# Patient Record
Sex: Female | Born: 1960 | Race: White | Hispanic: No | Marital: Single | Smoking: Never smoker
Health system: Southern US, Community
[De-identification: ages and names within clinical notes are randomized; demographics above are authoritative.]

## PROBLEM LIST (undated history)

## (undated) DIAGNOSIS — M19049 Primary osteoarthritis, unspecified hand: Secondary | ICD-10-CM

## (undated) DIAGNOSIS — Z8489 Family history of other specified conditions: Secondary | ICD-10-CM

## (undated) DIAGNOSIS — J309 Allergic rhinitis, unspecified: Secondary | ICD-10-CM

## (undated) DIAGNOSIS — F988 Other specified behavioral and emotional disorders with onset usually occurring in childhood and adolescence: Secondary | ICD-10-CM

## (undated) DIAGNOSIS — Z973 Presence of spectacles and contact lenses: Secondary | ICD-10-CM

## (undated) DIAGNOSIS — J320 Chronic maxillary sinusitis: Secondary | ICD-10-CM

## (undated) DIAGNOSIS — R42 Dizziness and giddiness: Secondary | ICD-10-CM

## (undated) DIAGNOSIS — F4321 Adjustment disorder with depressed mood: Secondary | ICD-10-CM

## (undated) DIAGNOSIS — N921 Excessive and frequent menstruation with irregular cycle: Secondary | ICD-10-CM

## (undated) DIAGNOSIS — I493 Ventricular premature depolarization: Secondary | ICD-10-CM

## (undated) DIAGNOSIS — R011 Cardiac murmur, unspecified: Secondary | ICD-10-CM

## (undated) DIAGNOSIS — Z9889 Other specified postprocedural states: Secondary | ICD-10-CM

## (undated) DIAGNOSIS — R112 Nausea with vomiting, unspecified: Secondary | ICD-10-CM

## (undated) DIAGNOSIS — H8109 Meniere's disease, unspecified ear: Secondary | ICD-10-CM

## (undated) HISTORY — PX: APPENDECTOMY: SHX54

## (undated) HISTORY — DX: Primary osteoarthritis, unspecified hand: M19.049

## (undated) HISTORY — DX: Ventricular premature depolarization: I49.3

## (undated) HISTORY — DX: Dizziness and giddiness: R42

## (undated) HISTORY — DX: Allergic rhinitis, unspecified: J30.9

## (undated) HISTORY — DX: Chronic maxillary sinusitis: J32.0

## (undated) HISTORY — DX: Excessive and frequent menstruation with irregular cycle: N92.1

## (undated) HISTORY — DX: Adjustment disorder with depressed mood: F43.21

---

## 1998-08-30 ENCOUNTER — Other Ambulatory Visit: Admission: RE | Admit: 1998-08-30 | Discharge: 1998-08-30 | Payer: Self-pay | Admitting: *Deleted

## 1999-06-08 ENCOUNTER — Other Ambulatory Visit: Admission: RE | Admit: 1999-06-08 | Discharge: 1999-06-08 | Payer: Self-pay | Admitting: Obstetrics and Gynecology

## 1999-07-11 ENCOUNTER — Other Ambulatory Visit: Admission: RE | Admit: 1999-07-11 | Discharge: 1999-07-11 | Payer: Self-pay | Admitting: Obstetrics and Gynecology

## 1999-07-18 ENCOUNTER — Encounter: Admission: RE | Admit: 1999-07-18 | Discharge: 1999-07-18 | Payer: Self-pay | Admitting: Obstetrics and Gynecology

## 1999-07-18 ENCOUNTER — Encounter: Payer: Self-pay | Admitting: Obstetrics and Gynecology

## 1999-08-15 ENCOUNTER — Other Ambulatory Visit: Admission: RE | Admit: 1999-08-15 | Discharge: 1999-08-15 | Payer: Self-pay | Admitting: Obstetrics and Gynecology

## 1999-11-01 ENCOUNTER — Emergency Department (HOSPITAL_COMMUNITY): Admission: EM | Admit: 1999-11-01 | Discharge: 1999-11-01 | Payer: Self-pay | Admitting: Emergency Medicine

## 2000-12-11 ENCOUNTER — Other Ambulatory Visit: Admission: RE | Admit: 2000-12-11 | Discharge: 2000-12-11 | Payer: Self-pay | Admitting: *Deleted

## 2001-03-24 ENCOUNTER — Encounter: Admission: RE | Admit: 2001-03-24 | Discharge: 2001-03-24 | Payer: Self-pay | Admitting: *Deleted

## 2001-03-24 ENCOUNTER — Encounter: Payer: Self-pay | Admitting: *Deleted

## 2002-01-16 ENCOUNTER — Other Ambulatory Visit: Admission: RE | Admit: 2002-01-16 | Discharge: 2002-01-16 | Payer: Self-pay | Admitting: Obstetrics and Gynecology

## 2002-04-21 ENCOUNTER — Encounter: Payer: Self-pay | Admitting: Cardiovascular Disease

## 2002-04-21 ENCOUNTER — Encounter: Admission: RE | Admit: 2002-04-21 | Discharge: 2002-04-21 | Payer: Self-pay | Admitting: Cardiovascular Disease

## 2003-01-19 ENCOUNTER — Other Ambulatory Visit: Admission: RE | Admit: 2003-01-19 | Discharge: 2003-01-19 | Payer: Self-pay | Admitting: Obstetrics and Gynecology

## 2003-09-25 HISTORY — PX: ENDOMETRIAL ABLATION: SHX621

## 2003-09-25 HISTORY — PX: DILATION AND CURETTAGE OF UTERUS: SHX78

## 2003-10-06 ENCOUNTER — Other Ambulatory Visit: Admission: RE | Admit: 2003-10-06 | Discharge: 2003-10-06 | Payer: Self-pay | Admitting: Family Medicine

## 2003-11-26 ENCOUNTER — Emergency Department (HOSPITAL_COMMUNITY): Admission: EM | Admit: 2003-11-26 | Discharge: 2003-11-26 | Payer: Self-pay | Admitting: Emergency Medicine

## 2004-04-26 ENCOUNTER — Encounter (INDEPENDENT_AMBULATORY_CARE_PROVIDER_SITE_OTHER): Payer: Self-pay | Admitting: Specialist

## 2004-04-26 ENCOUNTER — Ambulatory Visit (HOSPITAL_COMMUNITY): Admission: RE | Admit: 2004-04-26 | Discharge: 2004-04-26 | Payer: Self-pay | Admitting: *Deleted

## 2004-05-01 ENCOUNTER — Encounter: Admission: RE | Admit: 2004-05-01 | Discharge: 2004-05-01 | Payer: Self-pay | Admitting: Family Medicine

## 2004-06-06 ENCOUNTER — Encounter: Admission: RE | Admit: 2004-06-06 | Discharge: 2004-06-06 | Payer: Self-pay | Admitting: *Deleted

## 2005-08-28 ENCOUNTER — Encounter: Admission: RE | Admit: 2005-08-28 | Discharge: 2005-08-28 | Payer: Self-pay | Admitting: *Deleted

## 2006-09-24 HISTORY — PX: INGUINAL HERNIA REPAIR: SUR1180

## 2007-02-19 ENCOUNTER — Ambulatory Visit (HOSPITAL_BASED_OUTPATIENT_CLINIC_OR_DEPARTMENT_OTHER): Admission: RE | Admit: 2007-02-19 | Discharge: 2007-02-19 | Payer: Self-pay | Admitting: *Deleted

## 2007-05-22 ENCOUNTER — Encounter: Admission: RE | Admit: 2007-05-22 | Discharge: 2007-05-22 | Payer: Self-pay | Admitting: *Deleted

## 2007-07-01 ENCOUNTER — Emergency Department (HOSPITAL_COMMUNITY): Admission: EM | Admit: 2007-07-01 | Discharge: 2007-07-01 | Payer: Self-pay | Admitting: Emergency Medicine

## 2007-09-01 ENCOUNTER — Encounter: Admission: RE | Admit: 2007-09-01 | Discharge: 2007-09-01 | Payer: Self-pay | Admitting: General Surgery

## 2007-12-19 ENCOUNTER — Encounter: Admission: RE | Admit: 2007-12-19 | Discharge: 2007-12-19 | Payer: Self-pay | Admitting: Obstetrics and Gynecology

## 2007-12-29 ENCOUNTER — Encounter: Admission: RE | Admit: 2007-12-29 | Discharge: 2007-12-29 | Payer: Self-pay | Admitting: Family Medicine

## 2007-12-30 ENCOUNTER — Encounter: Admission: RE | Admit: 2007-12-30 | Discharge: 2007-12-30 | Payer: Self-pay | Admitting: Family Medicine

## 2008-08-12 ENCOUNTER — Emergency Department (HOSPITAL_COMMUNITY): Admission: EM | Admit: 2008-08-12 | Discharge: 2008-08-12 | Payer: Self-pay | Admitting: Emergency Medicine

## 2008-11-16 ENCOUNTER — Encounter: Admission: RE | Admit: 2008-11-16 | Discharge: 2008-11-16 | Payer: Self-pay | Admitting: Family Medicine

## 2009-12-07 ENCOUNTER — Emergency Department (HOSPITAL_COMMUNITY): Admission: EM | Admit: 2009-12-07 | Discharge: 2009-12-07 | Payer: Self-pay | Admitting: Emergency Medicine

## 2009-12-27 ENCOUNTER — Encounter: Admission: RE | Admit: 2009-12-27 | Discharge: 2009-12-27 | Payer: Self-pay | Admitting: Family Medicine

## 2010-07-06 ENCOUNTER — Encounter: Admission: RE | Admit: 2010-07-06 | Discharge: 2010-07-06 | Payer: Self-pay | Admitting: Family Medicine

## 2010-12-18 LAB — COMPREHENSIVE METABOLIC PANEL
ALT: 20 U/L (ref 0–35)
Albumin: 3.6 g/dL (ref 3.5–5.2)
Calcium: 8.6 mg/dL (ref 8.4–10.5)
GFR calc Af Amer: >60 mL/min (ref >60)
Glucose, Bld: 109 mg/dL — ABNORMAL HIGH (ref 70–99)
Sodium: 136 mEq/L (ref 135–145)
Total Protein: 5.8 g/dL — ABNORMAL LOW (ref 6.0–8.3)

## 2010-12-18 LAB — URINALYSIS, ROUTINE W REFLEX MICROSCOPIC
Glucose, UA: NEGATIVE mg/dL
Leukocytes, UA: NEGATIVE
pH: 5 (ref 5.0–8.0)

## 2010-12-18 LAB — DIFFERENTIAL
Eosinophils Absolute: 0.1 10*3/uL (ref 0.0–0.7)
Lymphs Abs: 1 10*3/uL (ref 0.7–4.0)
Monocytes Absolute: 0.8 10*3/uL (ref 0.1–1.0)
Monocytes Relative: 11 % (ref 3–12)
Neutrophils Relative %: 75 % (ref 43–77)

## 2010-12-18 LAB — CBC
Hemoglobin: 14.3 g/dL (ref 12.0–15.0)
MCHC: 35.1 g/dL (ref 30.0–36.0)
Platelets: 159 10*3/uL (ref 150–400)
RDW: 12.6 % (ref 11.5–15.5)

## 2010-12-18 LAB — URINE MICROSCOPIC-ADD ON

## 2011-02-06 NOTE — Op Note (Signed)
NAMEBRYANA, Brianna Rhodes                ACCOUNT NO.:  [id number]   MEDICAL RECORD NO.:  [id number]          PATIENT TYPE:  AMB   LOCATION:  [facility]                         FACILITY:  [facility]   PHYSICIAN:  Alfonse Ras, MD   DATE OF BIRTH:  06/27/1961   DATE OF PROCEDURE:  02/19/2007  DATE OF DISCHARGE:                               OPERATIVE REPORT   PREOPERATIVE DIAGNOSIS:  Left inguinal hernia.   POSTOPERATIVE DIAGNOSIS:  Left inguinal hernia.   OPERATION PERFORMED:  Left inguinal hernia repair with mesh.   SURGEON:  Alfonse Ras, MD   ANESTHESIA:   DESCRIPTION OF PROCEDURE:  The patient was taken to the operating room,  placed in supine position.  After adequate anesthesia was induced using  the laryngeal mask, the left groin was prepped and draped in the normal  sterile fashion.  Using an oblique incision over the inguinal canal, I  dissected down to the external oblique muscle.  The fascia was incised  along its fibers.  The round ligament was identified.  There was an  indirect hernia as well as a small direct hernia which were both reduced  easily.  The floor of Hesselbach's triangle was closed using interrupted  #1 Surgilons.  A piece of 1 x 4 Atrium mesh was then placed over the  repair and tacked using running 2-0 Prolene suture.  Adequate hemostasis  was ensured.  The external oblique fascia was closed with running 3-0  Vicryl suture.  Skin was closed with staples.  The patient tolerated the  procedure well and went to PACU in good condition.      Alfonse Ras, MD  Electronically Signed     KRE/MEDQ  D:  02/19/2007  T:  02/19/2007  Job:  [id number]

## 2011-02-09 NOTE — Op Note (Signed)
NAME:  Brianna Rhodes, Brianna Rhodes                          ACCOUNT NO.:  [id number]   MEDICAL RECORD NO.:  [id number]                   PATIENT TYPE:  AMB   LOCATION:  [facility]                                  FACILITY:  [facility]   PHYSICIAN:  McLendon-Chisholm B. Earlene Plater, M.D.               DATE OF BIRTH:  11/25/1960   DATE OF PROCEDURE:  04/26/2004  DATE OF DISCHARGE:                                 OPERATIVE REPORT   PREOPERATIVE DIAGNOSIS:  Abnormal uterine bleeding, questionable endometrial  polyp.   POSTOPERATIVE DIAGNOSIS:  Abnormal uterine bleeding, questionable  endometrial polyp.   OPERATION PERFORMED:  Hysteroscopy, dilation and curettage.  Attempted  NovaSure then changed to ThermaChoice endometrial ablation due to cervical  stenosis.   SURGEON:  Chester Holstein. Earlene Plater, M.D.   ANESTHESIA:  General and 20 mL of 1% Nesacaine paracervical block.   FINDINGS:  Shaggy endometrium without definitive polyp.  Otherwise no  abnormality.   SPECIMENS:  Endometrial curettings.   INDICATIONS FOR PROCEDURE:  Patient with a history of heavy and irregular  vaginal bleeding.  Recent ultrasound in the office showed thickened  endometrium, subsequent saline infusion suggested a polyp on the anterior  wall of the uterus.  The patient was also interested in definitive minimally  invasive treatment for her heavy menstrual periods.  She was advised of the  risks of surgery including infection, bleeding, uterine perforation, fluid  overload, damage to surrounding organs requiring surgery.   DESCRIPTION OF PROCEDURE:  The patient was taken to the operating room and  initially MAC anesthesia obtained.  She was placed in [place] stirrups,  prepped and draped in standard fashion and the bladder emptied with a red  rubber catheter.  Examination under anesthesia showed a normal sized  midplane uterus without adnexal masses palpable.  Paracervical block placed  with 20 mL of 1% Nesacaine.  Anterior lip of the cervix was grasped with a  single toothed tenaculum.  The cervix was dilated up to #21.  The cervix was  quite fragile and the single tooth pulled off a few times during the  dilation so I switched to a Jacobs tenaculum which seemed to hold somewhat  better. The diagnostic hysteroscope was inserted after being flushed with  Sorbitol and the cavity inspected.  There was no definitive polyp noted.  Some shabby endometrium was seen.  The endometrium was gently curetted and  reinspected, no other abnormalities found.   I attempted to dilate up to a diameter to allow passage of the NovaSure but  could not get the NovaSure device to pass beyond the internal cervical os  due to stenosis.  Given the previously mentioned fragility of the cervix, I  could not get a firm enough grasp on the cervical lip to properly dilate to  allow passage of the NovaSure device.  I therefore made a decision to switch  to the Gynecare  ThermaChoice as this dilator is  much smaller.   The ThermaChoice device was then primed and evacuated per protocol.  It was  inserted to the fundus and pressurized into the appropriate range which  activated the device.  It was then heated to 87 degrees and ablated for a  total of eight minutes, cooled down, depressurized and removed per the  protocol.  The hysteroscope was reinserted and good coverage was noted and  no abnormality seen.   The scope was removed and Christella Hartigan removed.  The cervix was bleeding from the  multiple tears from the tenaculum.  These were made hemostatic with pressure  and Monsel's solution.  The patient tolerated the procedure well.  There  were no complications.  She was taken to recovery room awake, alert in  stable condition.  The fluid deficit was 40 mL.                                               Gerri Spore B. Earlene Plater, M.D.    WBD/MEDQ  D:  04/26/2004  T:  04/26/2004  Job:  [id number]

## 2011-06-26 LAB — CBC
HCT: 31.3 — ABNORMAL LOW
Hemoglobin: 10.8 — ABNORMAL LOW
WBC: 7.7

## 2011-06-26 LAB — URINALYSIS, ROUTINE W REFLEX MICROSCOPIC
Glucose, UA: NEGATIVE
Nitrite: NEGATIVE
Protein, ur: NEGATIVE

## 2011-06-26 LAB — DIFFERENTIAL
Basophils Absolute: 0
Eosinophils Absolute: 0.2
Lymphocytes Relative: 19
Lymphs Abs: 1.5
Neutrophils Relative %: 65

## 2011-06-26 LAB — COMPREHENSIVE METABOLIC PANEL
AST: 17
Alkaline Phosphatase: 51
BUN: 12
CO2: 25
Chloride: 104
Creatinine, Ser: 0.89
Total Bilirubin: 0.9

## 2011-09-26 IMAGING — US US ABDOMEN COMPLETE
1 series · 14 of 25 positions shown · non-contrast
Comparison: None.

CLINICAL DATA: Mid epigastric pain.

ABDOMINAL ULTRASOUND COMPLETE

[Series 1: us abdomen complete · 0.19mm/px · 14 of 87 slices shown]
[im 1/87]
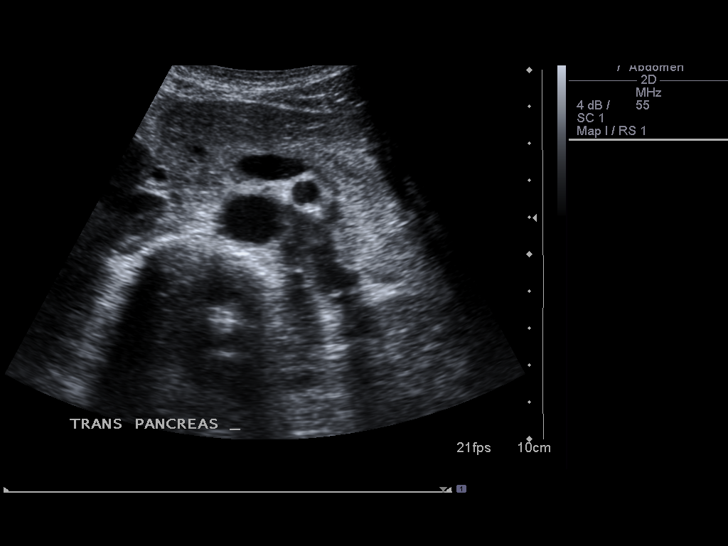
[im 8/87]
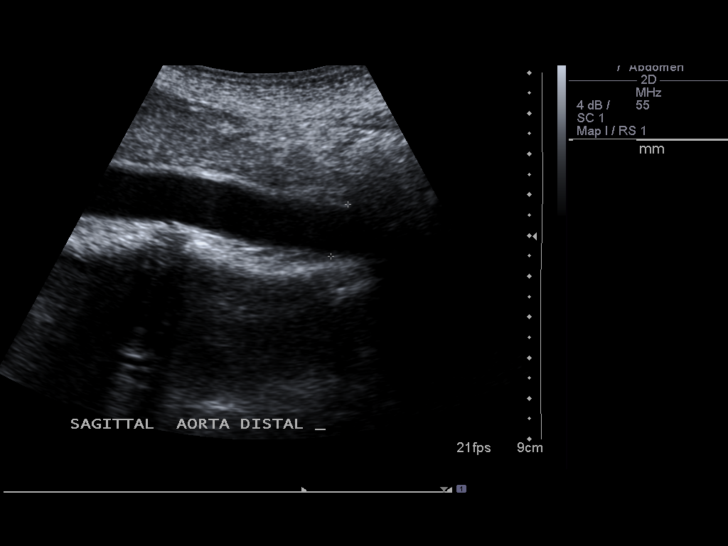
[im 15/87]
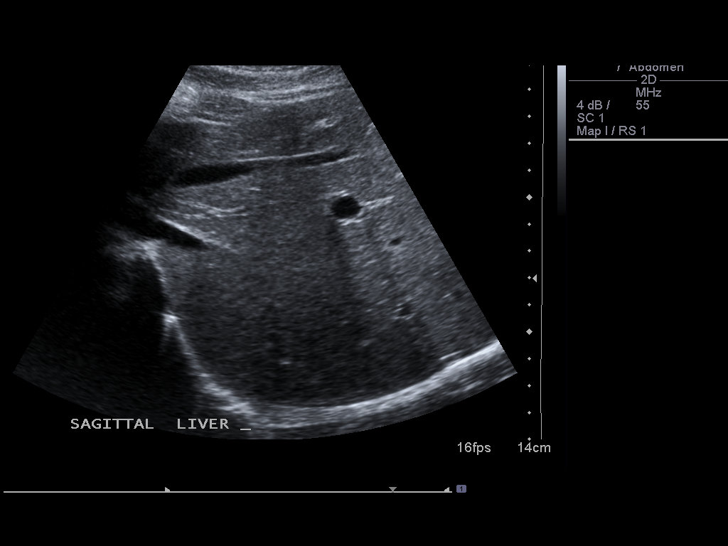
[im 22/87]
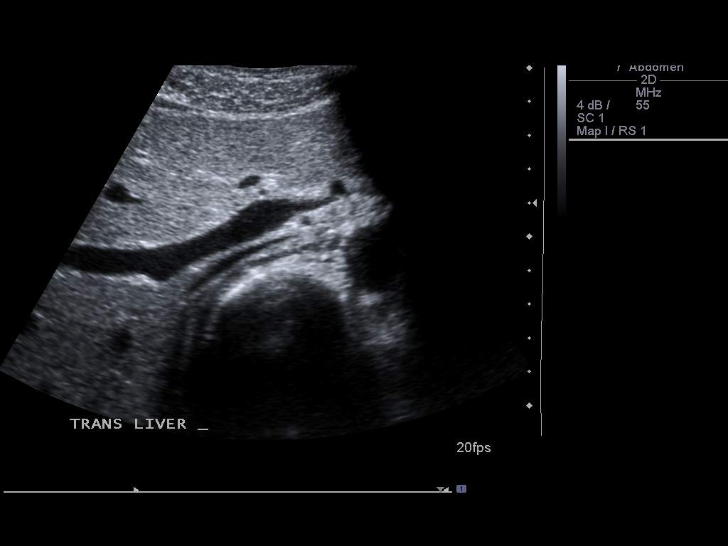
[im 29/87]
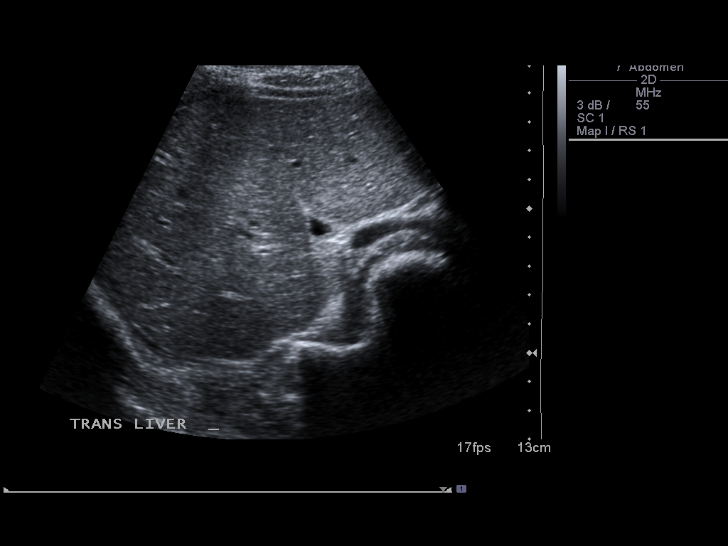
[im 33/87]
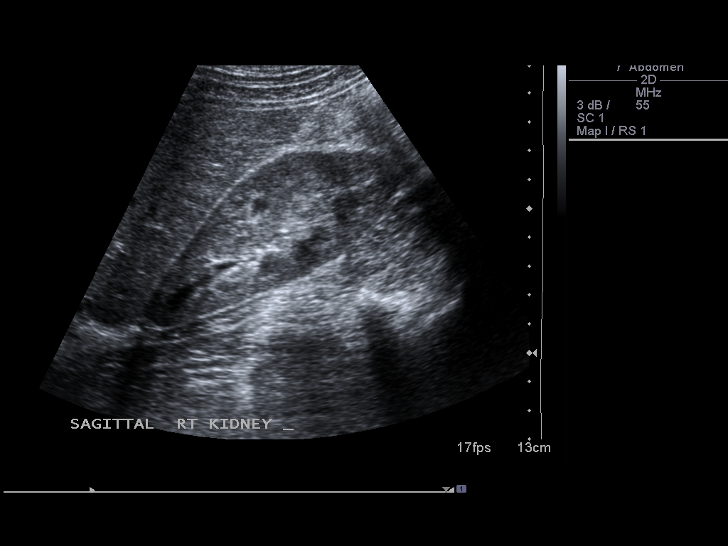
[im 40/87]
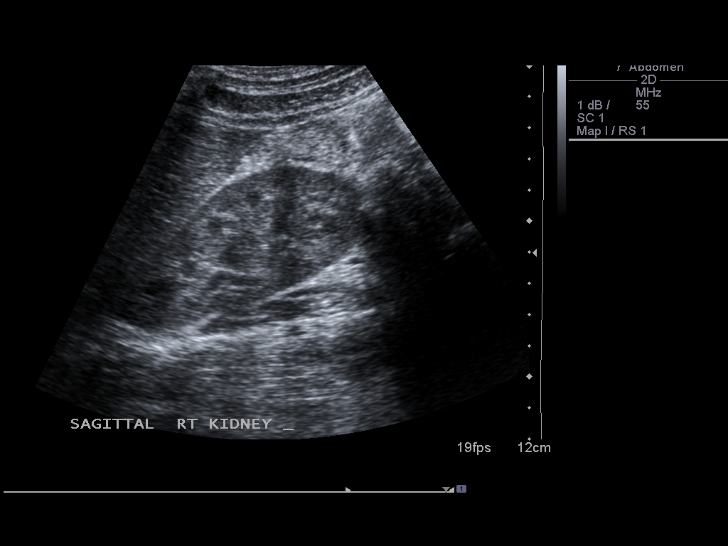
[im 47/87]
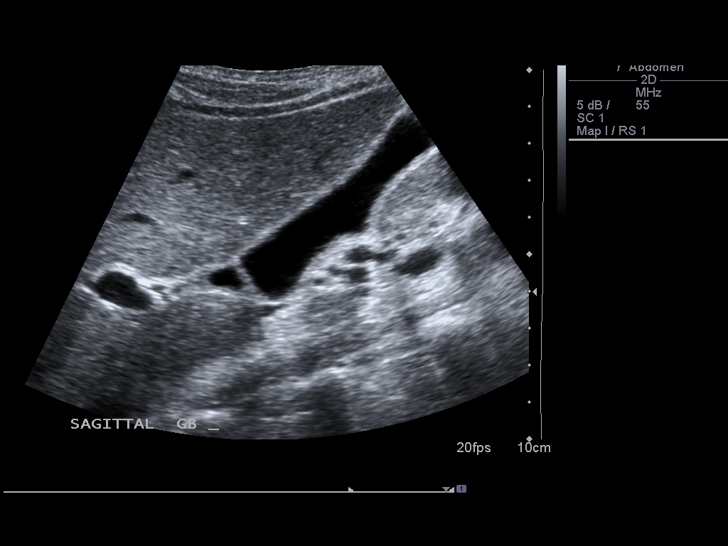
[im 54/87]
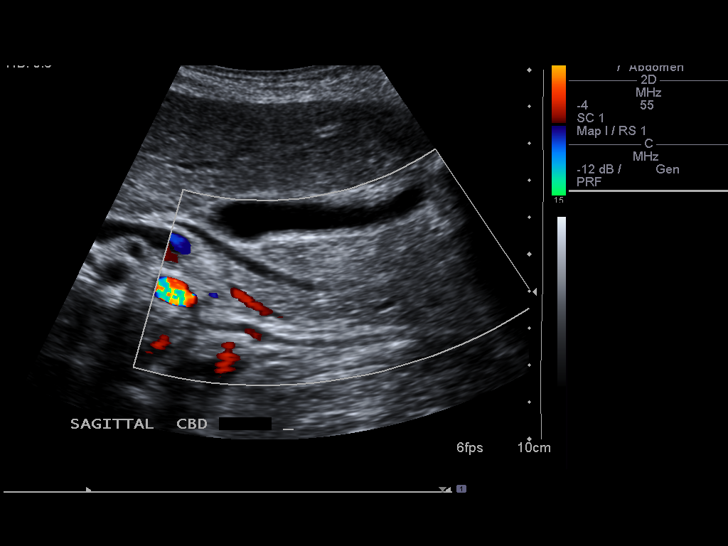
[im 58/87]
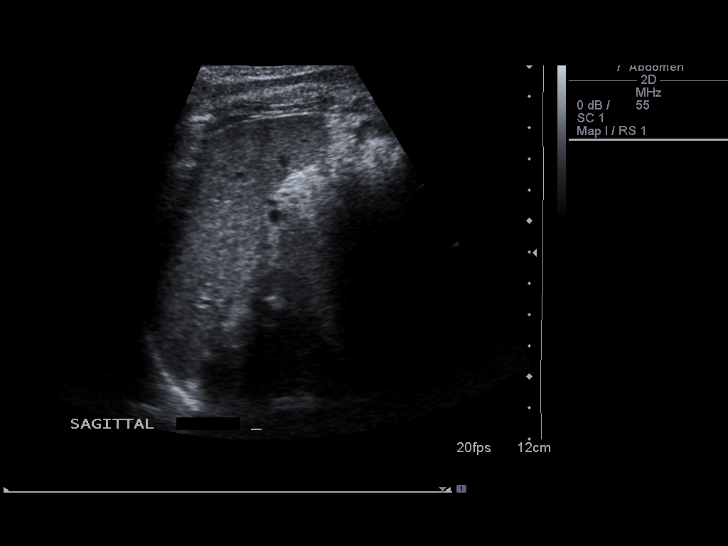
[im 65/87]
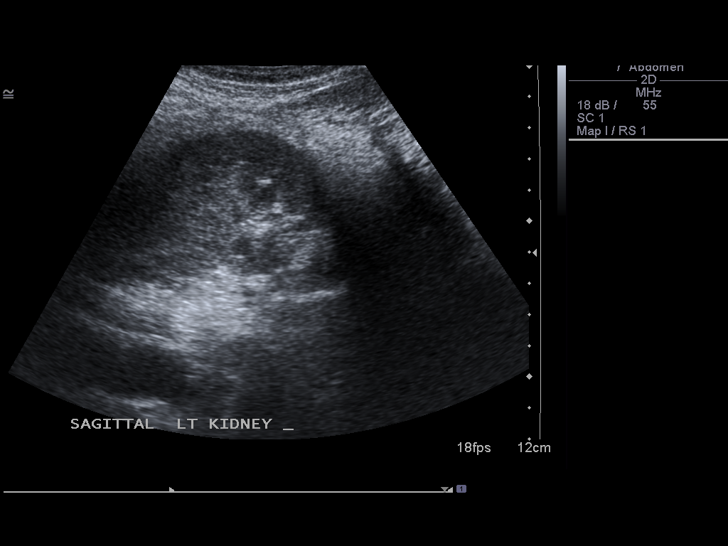
[im 72/87]
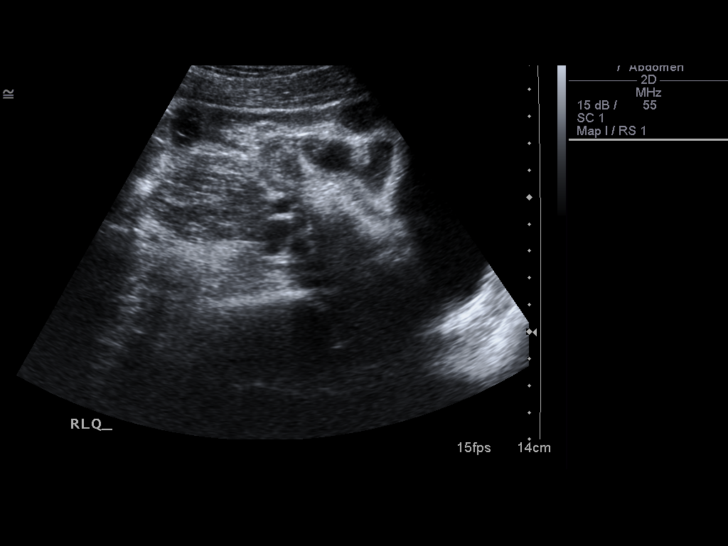
[im 79/87]
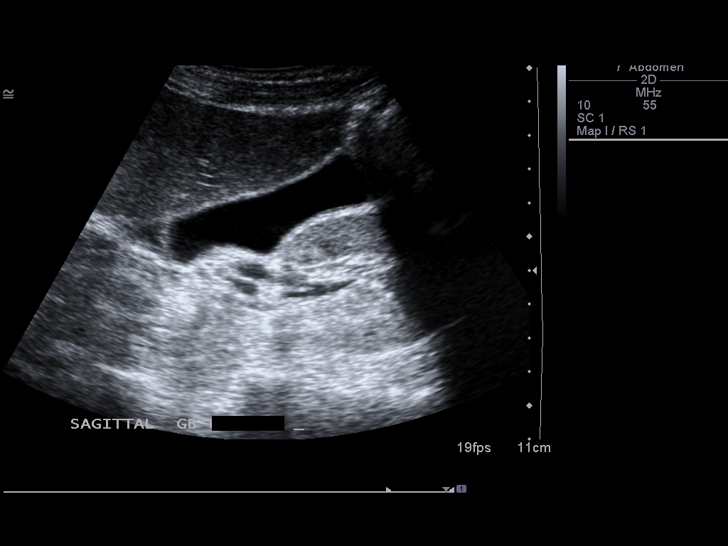
[im 87/87]
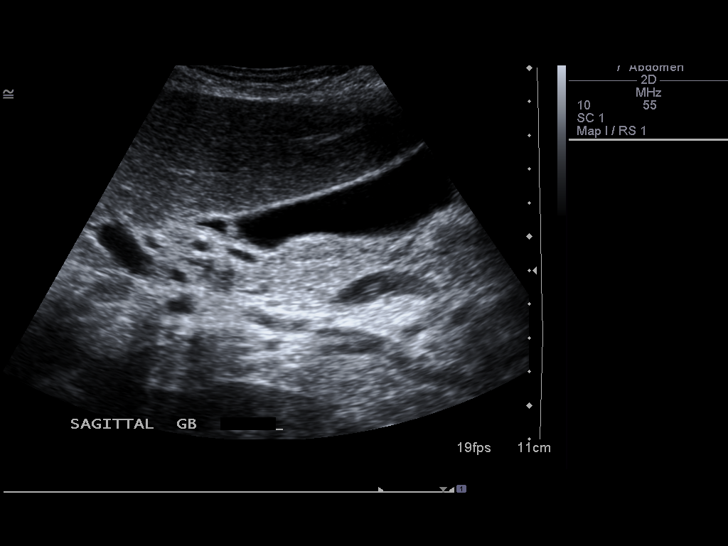

[14 of 25 positions shown; findings below may reference images not displayed]

FINDINGS: Gallbladder:  No gallstones, gallbladder wall thickening, or
pericholecystic fluid.  Negative ultrasound Murphy's sign.

Common Bile Duct:  Within normal limits in caliber.  Common duct
measures 3.3 mm in diameter.

Liver:  No focal lesion identified.  Within normal limits in
parenchymal echogenicity.  There is a trace of ascitic fluid
surrounding the liver.

IVC:  Appears normal.

Pancreas:  Although the pancreas is difficult to visualize in its
entirety, no focal pancreatic abnormality is identified.

Spleen:  Within normal limits in size and echotexture.  Splenic
length is 6.9 cm.

Right kidney:  Normal in size and parenchymal echogenicity.  No
evidence of mass or hydronephrosis.   10.6 cm right renal length.

Left kidney:  Normal in size and parenchymal echogenicity.  No
evidence of mass or hydronephrosis.  11.0 cm left renal length.

Abdominal Aorta:  No aneurysm identified.  2.1 cm maximum diameter
IMPRESSION: Normal gallbladder.  No biliary ductal dilatation.  Minimal
perihepatic ascitic fluid.

## 2011-10-16 IMAGING — US US BREAST BILATERAL
1 series · 12 of 12 positions shown · non-contrast
Comparison: 11/16/2008 and 12/19/2007

CLINICAL DATA: Patient presents for bilateral diagnostic mammogram
due to a palpable abnormality in the right axilla which she states
is unchanged over the past year as this was found represent a small
lymph node previously.  The patient also states a palpable
abnormality in the left axilla which comes and goes.

DIGITAL DIAGNOSTIC BILATERAL MAMMOGRAM WITH CAD AND BILATERAL
BREAST ULTRASOUND:

[Series 1: us breast bilateral · 12 of 12 slices shown]
[im 1/12]
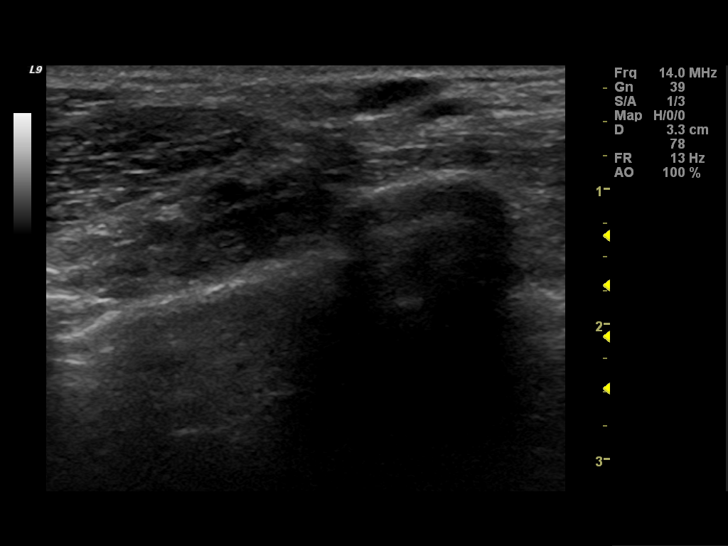
[im 2/12]
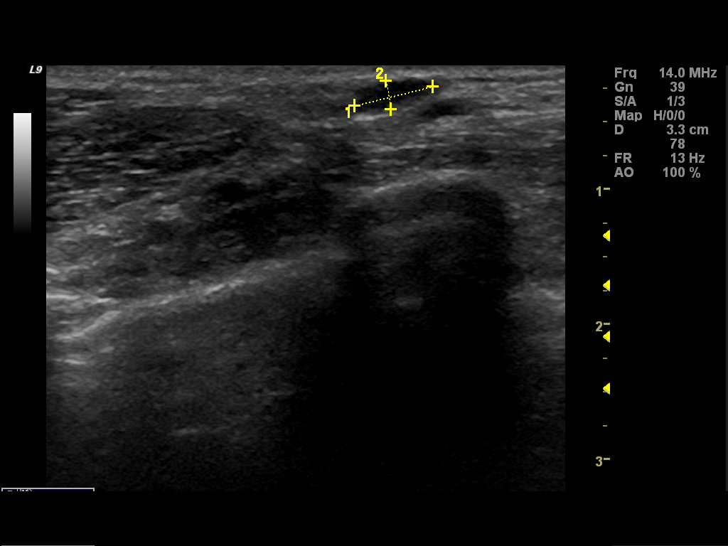
[im 3/12]
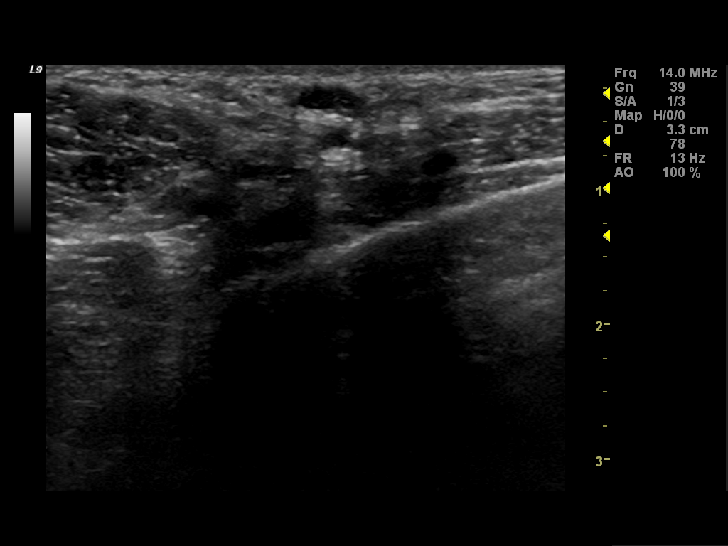
[im 4/12]
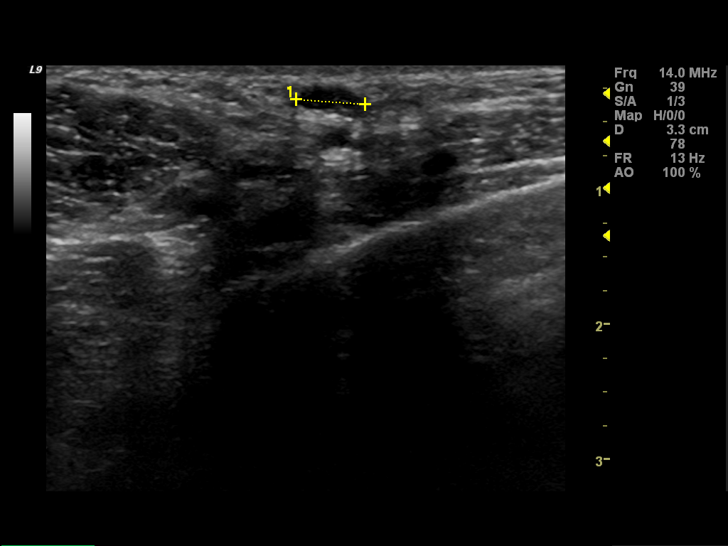
[im 5/12]
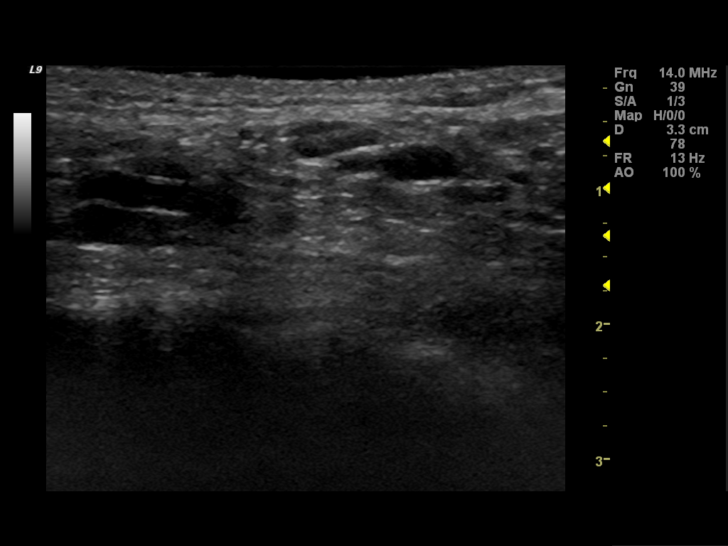
[im 6/12]
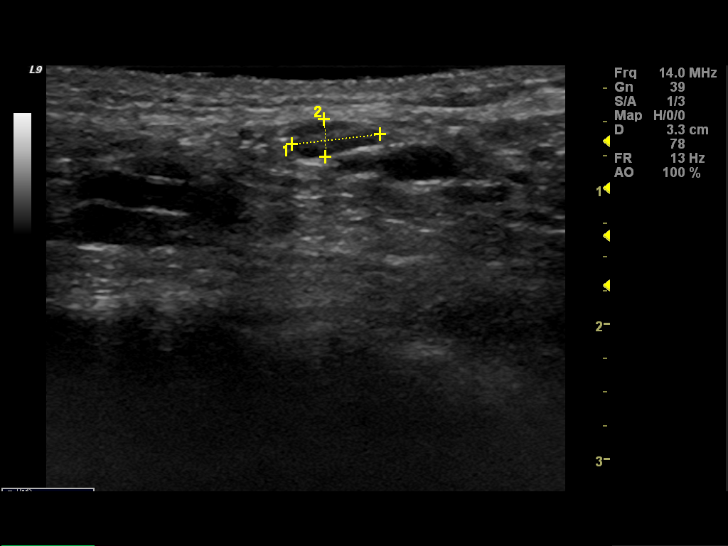
[im 7/12]
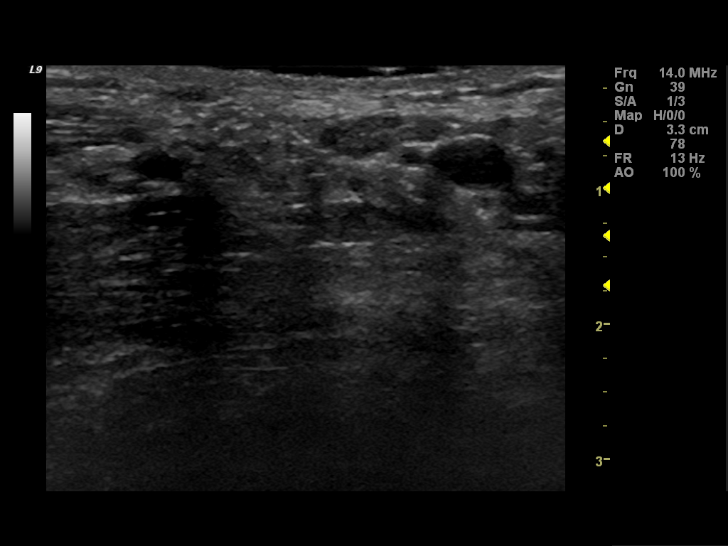
[im 8/12]
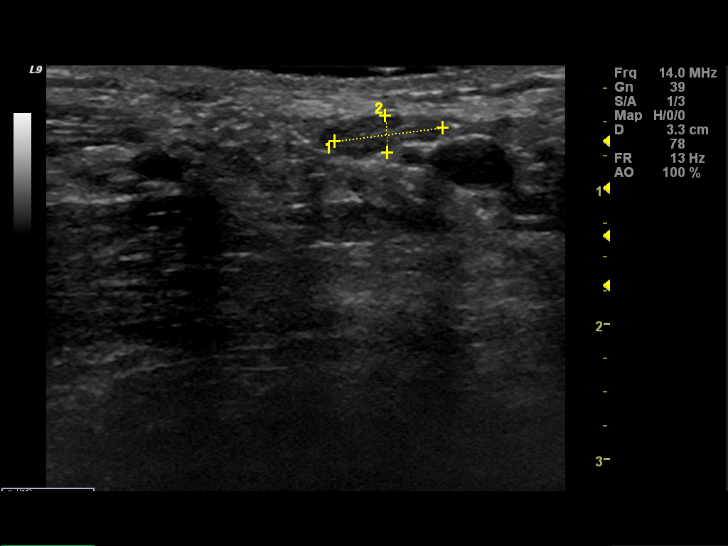
[im 9/12]
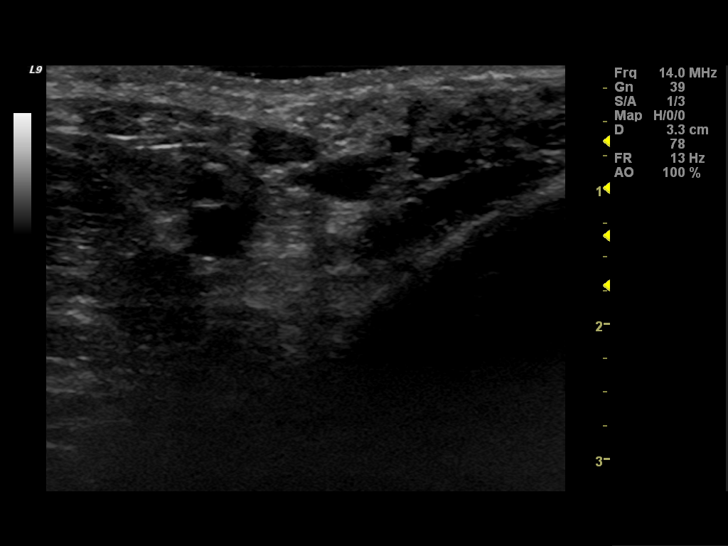
[im 10/12]
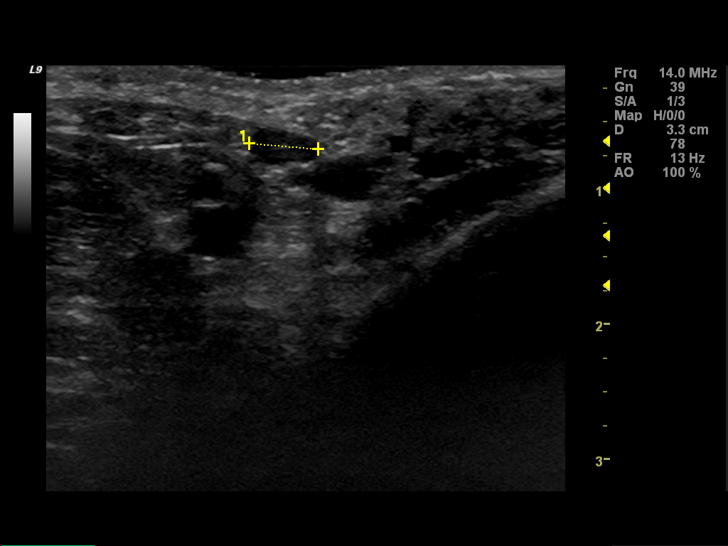
[im 11/12]
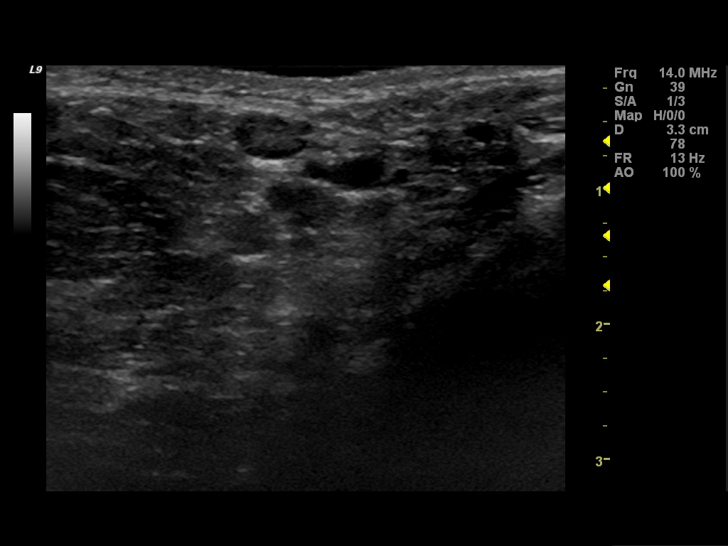
[im 12/12]
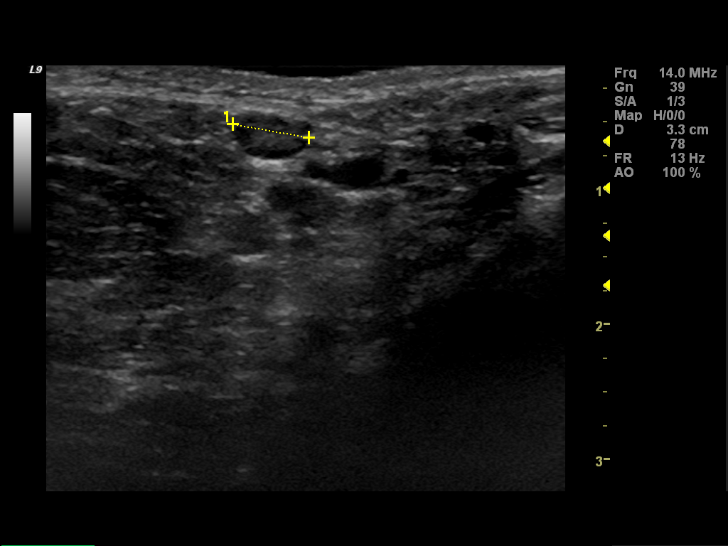

[12 of 12 positions shown; findings below may reference images not displayed]

FINDINGS: Exam demonstrates heterogeneously dense fibroglandular
tissue.  There is no spiculated mass, suspicious clusters of
microcalcifications or parenchymal distortion.  There is no
definite focal abnormality seen in the axillary regions to
correspond to patient's palpable abnormalities.
Mammographic images were processed with CAD.

Ultrasound is performed, showing an ovoid well-defined hypoechoic
mass just below the skin surface in the left axilla corresponding
to patient's palpable abnormality.  This has increased through
transmission with a a few scattered internal echoes suggesting that
it represents a minimally complicated cyst. This measures 2 x 5 x 6
mm.

Ultrasound over the right axilla in the area of patient's palpable
abnormality demonstrates two small adjacent lymph nodes measuring 7
and 8 mm respectively in greatest dimensions.
IMPRESSION: Two small adjacent lymph nodes in the right axilla corresponding to
the area of patient's palpable abnormality.  Findings suggesting a
6 mm minimally complicated cyst in the left axilla corresponding to
patient's second palpable abnormality.

Recommendations:  Recommend a 6-month follow-up ultrasound of the
left axillary region to document stability of this probable benign
finding.

BI-RADS CATEGORY 3:  Probably benign finding(s) - short interval
follow-up suggested.

## 2013-09-19 ENCOUNTER — Other Ambulatory Visit: Payer: Self-pay

## 2013-09-19 ENCOUNTER — Encounter (HOSPITAL_COMMUNITY): Payer: Self-pay | Admitting: Emergency Medicine

## 2013-09-19 ENCOUNTER — Emergency Department (HOSPITAL_COMMUNITY): Payer: BC Managed Care – PPO

## 2013-09-19 ENCOUNTER — Emergency Department (HOSPITAL_COMMUNITY)
Admission: EM | Admit: 2013-09-19 | Discharge: 2013-09-19 | Disposition: A | Discharge status: Home / Self Care | Payer: BC Managed Care – PPO | Attending: Emergency Medicine | Admitting: Emergency Medicine

## 2013-09-19 DIAGNOSIS — W1809XA Striking against other object with subsequent fall, initial encounter: Secondary | ICD-10-CM | POA: Insufficient documentation

## 2013-09-19 DIAGNOSIS — S0180XA Unspecified open wound of other part of head, initial encounter: Secondary | ICD-10-CM | POA: Insufficient documentation

## 2013-09-19 DIAGNOSIS — Z8669 Personal history of other diseases of the nervous system and sense organs: Secondary | ICD-10-CM | POA: Insufficient documentation

## 2013-09-19 DIAGNOSIS — Z23 Encounter for immunization: Secondary | ICD-10-CM | POA: Insufficient documentation

## 2013-09-19 DIAGNOSIS — S060X9A Concussion with loss of consciousness of unspecified duration, initial encounter: Secondary | ICD-10-CM | POA: Insufficient documentation

## 2013-09-19 DIAGNOSIS — IMO0002 Reserved for concepts with insufficient information to code with codable children: Secondary | ICD-10-CM | POA: Insufficient documentation

## 2013-09-19 DIAGNOSIS — S0990XA Unspecified injury of head, initial encounter: Secondary | ICD-10-CM

## 2013-09-19 DIAGNOSIS — Z87891 Personal history of nicotine dependence: Secondary | ICD-10-CM | POA: Insufficient documentation

## 2013-09-19 DIAGNOSIS — R55 Syncope and collapse: Secondary | ICD-10-CM | POA: Insufficient documentation

## 2013-09-19 DIAGNOSIS — Y9389 Activity, other specified: Secondary | ICD-10-CM | POA: Insufficient documentation

## 2013-09-19 DIAGNOSIS — S52502A Unspecified fracture of the lower end of left radius, initial encounter for closed fracture: Secondary | ICD-10-CM

## 2013-09-19 DIAGNOSIS — S52599A Other fractures of lower end of unspecified radius, initial encounter for closed fracture: Secondary | ICD-10-CM | POA: Insufficient documentation

## 2013-09-19 DIAGNOSIS — Y9289 Other specified places as the place of occurrence of the external cause: Secondary | ICD-10-CM | POA: Insufficient documentation

## 2013-09-19 HISTORY — DX: Meniere's disease, unspecified ear: H81.09

## 2013-09-19 LAB — CBC WITH DIFFERENTIAL/PLATELET
Basophils Absolute: 0 10*3/uL (ref 0.0–0.1)
Basophils Relative: 0 % (ref 0–1)
HCT: 41.6 % (ref 36.0–46.0)
Lymphocytes Relative: 8 % — ABNORMAL LOW (ref 12–46)
MCHC: 34.9 g/dL (ref 30.0–36.0)
Neutro Abs: 14 10*3/uL — ABNORMAL HIGH (ref 1.7–7.7)
Neutrophils Relative %: 85 % — ABNORMAL HIGH (ref 43–77)
Platelets: 159 10*3/uL (ref 150–400)
RDW: 12.3 % (ref 11.5–15.5)
WBC: 16.6 10*3/uL — ABNORMAL HIGH (ref 4.0–10.5)

## 2013-09-19 LAB — COMPREHENSIVE METABOLIC PANEL
ALT: 18 U/L (ref 0–35)
AST: 26 U/L (ref 0–37)
Albumin: 4.3 g/dL (ref 3.5–5.2)
CO2: 26 mEq/L (ref 19–32)
Chloride: 101 mEq/L (ref 96–112)
GFR calc non Af Amer: 81 mL/min — ABNORMAL LOW (ref >90)
Potassium: 4.1 mEq/L (ref 3.5–5.1)
Sodium: 139 mEq/L (ref 135–145)
Total Bilirubin: 0.6 mg/dL (ref 0.3–1.2)

## 2013-09-19 LAB — ETHANOL: Alcohol, Ethyl (B): <11 mg/dL (ref 0–11)

## 2013-09-19 MED ORDER — HYDROMORPHONE HCL PF 1 MG/ML IJ SOLN
1.0000 mg | Freq: Once | INTRAMUSCULAR | Status: AC
  Administered 2013-09-19: 1 mg via INTRAVENOUS
  Filled 2013-09-19: qty 1

## 2013-09-19 MED ORDER — TETANUS-DIPHTH-ACELL PERTUSSIS 5-2.5-18.5 LF-MCG/0.5 IM SUSP
0.5000 mL | Freq: Once | INTRAMUSCULAR | Status: AC
  Administered 2013-09-19: 0.5 mL via INTRAMUSCULAR
  Filled 2013-09-19: qty 0.5

## 2013-09-19 MED ORDER — HYDROCODONE-ACETAMINOPHEN 5-325 MG PO TABS: 1.0000 | ORAL_TABLET | ORAL | Status: DC | PRN

## 2013-09-19 MED ORDER — OXYCODONE-ACETAMINOPHEN 5-325 MG PO TABS: 2.0000 | ORAL_TABLET | ORAL | Status: DC | PRN

## 2013-09-19 MED ORDER — ONDANSETRON HCL 4 MG/2ML IJ SOLN
4.0000 mg | Freq: Once | INTRAMUSCULAR | Status: AC
  Administered 2013-09-19: 4 mg via INTRAVENOUS
  Filled 2013-09-19: qty 2

## 2013-09-19 MED ORDER — LORAZEPAM 2 MG/ML IJ SOLN
1.0000 mg | Freq: Once | INTRAMUSCULAR | Status: AC
  Administered 2013-09-19: 1 mg via INTRAVENOUS
  Filled 2013-09-19: qty 1

## 2013-09-19 NOTE — ED Provider Notes (Signed)
Medical screening examination/treatment/procedure(s) were conducted as a shared visit with non-physician practitioner(s) and myself.  I personally evaluated the patient during the encounter.  EKG Interpretation   None        Glynn Octave, MD 09/19/13 [phone]

## 2013-09-19 NOTE — ED Provider Notes (Signed)
LACERATION REPAIR Performed by: Dorthula Matas Authorized by: Dorthula Matas Consent: Verbal consent obtained. Risks and benefits: risks, benefits and alternatives were discussed Consent given by: patient Patient identity confirmed: provided demographic data Prepped and Draped in normal sterile fashion Wound explored  Laceration Location: left temple  Laceration Length: 1 cm  No Foreign Bodies seen or palpated  Anesthesia: local infiltration  Local anesthetic: lidocaine 2% wo epinephrine  Anesthetic total: 2 ml  Irrigation method: syringe Amount of cleaning: standard  Skin closure: sutures  Number of sutures: 2  Technique: simple interrupted  Patient tolerance: Patient tolerated the procedure well with no immediate complications.  For HPI, PE, ROS, PE, and dispo see Dr. Christene Slates note.     Dorthula Matas, PA-C 09/19/13 1514

## 2013-09-19 NOTE — ED Notes (Addendum)
Pt arrived from home by [facility] after a fall off skateboard. Pt has left wrist deformity and lac to left side of forehead. After fall pt went inside to use the bathroom and she had a syncopal episode witnessed by friend. EMS administered Fentanyl 250 mcg and Zofran 4mg . Denies any ETOH and drugs. EMS stated that pulse to left wrist weak on palpation. Good cap refill.

## 2013-09-19 NOTE — ED Provider Notes (Signed)
CSN: [id number]     Arrival date & time 09/19/13  1254 History   First MD Initiated Contact with Patient 09/19/13 1301     Chief Complaint  Patient presents with  . Wrist Pain  . Head Laceration  . Fall  . Loss of Consciousness   (Consider location/radiation/quality/duration/timing/severity/associated sxs/prior Treatment) HPI Comments: Patient injured her left wrist after falling off of a scooter. She has pain and swelling and deformity to her left wrist. She went inside to use the bathroom where she apparently had a syncopal episode and fell striking her head. She has a laceration to the left temporal area. She's complaining of head and wrist pain. Denies any neck, back, chest or abdominal pain. Denies any anticoagulant use. Denies any focal weakness, numbness or tingling.  The history is provided by the patient and the EMS personnel.    Past Medical History  Diagnosis Date  . Meniere's disease    History reviewed. No pertinent past surgical history. No family history on file. History  Substance Use Topics  . Smoking status: Former Games developer  . Smokeless tobacco: Not on file  . Alcohol Use: No   OB History   Grav Para Term Preterm Abortions TAB SAB Ect Mult Living                 Review of Systems  Constitutional: Negative for fever, activity change and appetite change.  HENT: Negative for dental problem.   Respiratory: Negative for cough, chest tightness and shortness of breath.   Cardiovascular: Negative for chest pain.  Gastrointestinal: Negative for nausea, vomiting and abdominal pain.  Genitourinary: Negative for dysuria and hematuria.  Musculoskeletal: Positive for arthralgias and myalgias.  Skin: Positive for wound.  Neurological: Positive for headaches.  A complete 10 system review of systems was obtained and all systems are negative except as noted in the HPI and PMH.    Allergies  Percodan  Home Medications   Current Outpatient Rx  Name  Route  Sig   Dispense  Refill  . amphetamine-dextroamphetamine (ADDERALL) 10 MG tablet   Oral   Take 10 mg by mouth 4 (four) times daily as needed (for ADHD).         Marland Kitchen OVER THE COUNTER MEDICATION   Both Eyes   Place 1 drop into both eyes daily as needed (for dry eys).         . triamcinolone (NASACORT) 55 MCG/ACT AERO nasal inhaler   Nasal   Place 2 sprays into the nose daily.         Marland Kitchen oxyCODONE-acetaminophen (PERCOCET/ROXICET) 5-325 MG per tablet   Oral   Take 2 tablets by mouth every 4 (four) hours as needed for severe pain.   15 tablet   0    BP 121/68  Pulse 79  Temp(Src) 97.4 F (36.3 C) (Oral)  Resp 18  SpO2 96% Physical Exam  Constitutional: She is oriented to person, place, and time. She appears well-developed and well-nourished. No distress.  HENT:  Head: Normocephalic and atraumatic.  Mouth/Throat: Oropharynx is clear and moist. No oropharyngeal exudate.  1 cm vertical laceration to L temple, does not involve lid margin  Eyes: Conjunctivae and EOM are normal. Pupils are equal, round, and reactive to light.  Neck: Normal range of motion. Neck supple.  No C spine tenderness  Cardiovascular: Normal rate, regular rhythm and normal heart sounds.   No murmur heard. Pulmonary/Chest: Breath sounds normal. No respiratory distress.  Abdominal: Soft. There is no tenderness.  There is no rebound and no guarding.  Musculoskeletal: Normal range of motion. She exhibits edema and tenderness.  Deformity and swelling to L wrist.  +2 radial pulse. Reduced ROM of L hand 2/2 pain  Neurological: She is alert and oriented to person, place, and time. No cranial nerve deficit. She exhibits normal muscle tone. Coordination normal.  Skin: Skin is warm.    ED Course  Procedures (including critical care time) Labs Review Labs Reviewed  CBC WITH DIFFERENTIAL - Abnormal; Notable for the following:    WBC 16.6 (*)    Neutrophils Relative % 85 (*)    Neutro Abs 14.0 (*)    Lymphocytes  Relative 8 (*)    Monocytes Absolute 1.1 (*)    All other components within normal limits  COMPREHENSIVE METABOLIC PANEL - Abnormal; Notable for the following:    GFR calc non Af Amer 81 (*)    All other components within normal limits  ETHANOL  TROPONIN I   Imaging Review Dg Chest 2 View  09/19/2013   CLINICAL DATA:  Fall with injury.  EXAM: CHEST - 2 VIEW  COMPARISON:  None  FINDINGS: The heart size and mediastinal contours are within normal limits. There is no evidence of pulmonary edema, consolidation, pneumothorax or pleural fluid. The visualized skeletal structures are unremarkable.  IMPRESSION: No active disease.   Electronically Signed   By: Irish Lack M.D.   On: 09/19/2013 14:57   Dg Pelvis 1-2 Views  09/19/2013   CLINICAL DATA:  Fall, loss of consciousness  EXAM: PELVIS - 1-2 VIEW  COMPARISON:  None  FINDINGS: Bones appear demineralized.  Symmetric hip and SI joints.  No acute fracture, dislocation or bone destruction.  Soft tissues unremarkable.  IMPRESSION: No acute abnormalities.   Electronically Signed   By: Ulyses Southward M.D.   On: 09/19/2013 14:53   Dg Forearm Left  09/19/2013   CLINICAL DATA:  Fall with left forearm and wrist injury.  EXAM: LEFT FOREARM - 2 VIEW  COMPARISON:  Left wrist films earlier today.  FINDINGS: As described on the wrist study, there is evidence of an acute fracture of the distal radius. No proximal ulnar or radial injuries are identified. No foreign bodies are seen.  IMPRESSION: No additional injuries other than the previously described distal radial fracture.   Electronically Signed   By: Irish Lack M.D.   On: 09/19/2013 14:56   Dg Wrist 2 Views Left  09/19/2013   CLINICAL DATA:  Post reduction images of the left wrist.  EXAM: LEFT WRIST - 2 VIEW  COMPARISON:  Pre reduction images, 09/19/2013 at 1417 hr  FINDINGS: There has been significant reduction of the displaced distal radial fracture. The dorsal displacement has been reduced to near  anatomic. There is mild residual radial displacement. Radial length has been reduced to anatomic. There is neutral angulation of the distal articular surface. Radial inclination is near anatomic.  IMPRESSION: Significant closed reduction of the displaced distal left radial fracture.   Electronically Signed   By: Amie Portland M.D.   On: 09/19/2013 17:32   Dg Wrist Complete Left  09/19/2013   CLINICAL DATA:  Fall with left wrist injury.  EXAM: LEFT WRIST - COMPLETE 3+ VIEW  COMPARISON:  None.  FINDINGS: Comminuted, impacted and dorsally angulated fracture of the distal radial metaphysis identified. No other fractures are identified. Impaction results in ulna plus variance. No carpal bone injuries are identified.  IMPRESSION: Comminuted, impacted and dorsally angulated fracture of the  distal radial metaphysis.   Electronically Signed   By: Irish Lack M.D.   On: 09/19/2013 14:54   Ct Head Wo Contrast  09/19/2013   CLINICAL DATA:  Fall. Head injury. Loss of consciousness. Headache.  EXAM: CT HEAD WITHOUT CONTRAST  CT CERVICAL SPINE WITHOUT CONTRAST  TECHNIQUE: Multidetector CT imaging of the head and cervical spine was performed following the standard protocol without intravenous contrast. Multiplanar CT image reconstructions of the cervical spine were also generated.  COMPARISON:  MRI head 07/06/2010  FINDINGS: CT HEAD FINDINGS  Ventricle size is normal. Negative for acute hemorrhage. Negative for infarct, mass or skull fracture.  CT CERVICAL SPINE FINDINGS  Negative for fracture.  Cervical kyphosis.  Disc degeneration and spondylosis C4-5 and C5-6.  IMPRESSION: Negative CT of the head.  Cervical spondylosis C4-5 and C5-6.  Negative for fracture   Electronically Signed   By: Marlan Palau M.D.   On: 09/19/2013 14:27   Ct Cervical Spine Wo Contrast  09/19/2013   CLINICAL DATA:  Fall. Head injury. Loss of consciousness. Headache.  EXAM: CT HEAD WITHOUT CONTRAST  CT CERVICAL SPINE WITHOUT CONTRAST   TECHNIQUE: Multidetector CT imaging of the head and cervical spine was performed following the standard protocol without intravenous contrast. Multiplanar CT image reconstructions of the cervical spine were also generated.  COMPARISON:  MRI head 07/06/2010  FINDINGS: CT HEAD FINDINGS  Ventricle size is normal. Negative for acute hemorrhage. Negative for infarct, mass or skull fracture.  CT CERVICAL SPINE FINDINGS  Negative for fracture.  Cervical kyphosis.  Disc degeneration and spondylosis C4-5 and C5-6.  IMPRESSION: Negative CT of the head.  Cervical spondylosis C4-5 and C5-6.  Negative for fracture   Electronically Signed   By: Marlan Palau M.D.   On: 09/19/2013 14:27    EKG Interpretation   None       MDM   1. Distal radius fracture, left, closed, initial encounter   2. Head injury, initial encounter   3. Laceration    Mechanical fall off of scooter injuring left wrist. Patient then walked into the house and had a syncopal episode striking her head and losing consciousness. Does not use any blood thinners. No vomiting. Neurologically intact. No abdominal pain, chest pain, back pain.  CT head and C-spine negative. Laceration repaired by PA green. Noted  comminuted displaced distal radius fracture. Radial pulse intact.  D/w Dr. Merlyn Lot who will reduce at bedside.  Patient will need suture removal in 5-7 days.  Followup with Dr. Merlyn Lot this week.  Will discharge with pain medications. Post reduction Xrays pending at time of sign out to Dr. Bebe Shaggy.    Date: 09/19/2013  Rate: 98  Rhythm: normal sinus rhythm  QRS Axis: normal  Intervals: normal  ST/T Wave abnormalities: normal  Conduction Disutrbances:none  Narrative Interpretation:   Old EKG Reviewed: none available    Glynn Octave, MD 09/19/13 1746
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
## 2013-09-19 NOTE — Progress Notes (Signed)
Orthopedic Tech Progress Note Patient Details:  Brianna Rhodes [birth year] [id number]  Ortho Devices Type of Ortho Device: Arm sling;Sugartong splint Ortho Device/Splint Interventions: Application   Shawnie Pons 09/19/2013, 4:24 PM

## 2013-09-19 NOTE — ED Provider Notes (Deleted)
LACERATION REPAIR Performed by: Joya Gaskins Consent: Verbal consent obtained. Risks and benefits: risks, benefits and alternatives were discussed Patient identity confirmed: provided demographic data Time out performed prior to procedure Prepped and Draped in normal sterile fashion Wound explored Laceration Location: left temple Laceration Length: 1 cm No Foreign Bodies seen or palpated Anesthesia: local infiltration Local anesthetic: lidocaine 2% wo epinephrine Anesthetic total: 2 ml Irrigation method: syringe Amount of cleaning: standard Skin closure: sutures Number of sutures or staples: 2 Technique: simple interrupted Patient tolerance: Patient tolerated the procedure well with no immediate complications.  For HPI, PE, ROS, PE and disposition see DR. Rancours note    Joya Gaskins, MD 09/19/13 [phone]

## 2013-09-19 NOTE — ED Notes (Signed)
Ortho paged and responded. 

## 2013-09-19 NOTE — ED Provider Notes (Signed)
Pt improved She is ambulatory D/w dr Merlyn Lot, he will see in office Post reduction is appropriate Pt feels comfortable for d/c home  Joya Gaskins, MD 09/19/13 [phone]

## 2013-09-19 NOTE — ED Notes (Signed)
Pt ambulated very well 

## 2013-09-20 ENCOUNTER — Other Ambulatory Visit: Payer: Self-pay

## 2013-09-20 ENCOUNTER — Emergency Department (HOSPITAL_COMMUNITY)
Admission: EM | Admit: 2013-09-20 | Discharge: 2013-09-20 | Disposition: A | Discharge status: Home / Self Care | Payer: BC Managed Care – PPO | Attending: Emergency Medicine | Admitting: Emergency Medicine

## 2013-09-20 ENCOUNTER — Encounter (HOSPITAL_COMMUNITY): Payer: Self-pay | Admitting: Emergency Medicine

## 2013-09-20 DIAGNOSIS — IMO0002 Reserved for concepts with insufficient information to code with codable children: Secondary | ICD-10-CM | POA: Insufficient documentation

## 2013-09-20 DIAGNOSIS — R296 Repeated falls: Secondary | ICD-10-CM | POA: Insufficient documentation

## 2013-09-20 DIAGNOSIS — Z8669 Personal history of other diseases of the nervous system and sense organs: Secondary | ICD-10-CM | POA: Insufficient documentation

## 2013-09-20 DIAGNOSIS — Y9389 Activity, other specified: Secondary | ICD-10-CM | POA: Insufficient documentation

## 2013-09-20 DIAGNOSIS — M79602 Pain in left arm: Secondary | ICD-10-CM

## 2013-09-20 DIAGNOSIS — Y92009 Unspecified place in unspecified non-institutional (private) residence as the place of occurrence of the external cause: Secondary | ICD-10-CM | POA: Insufficient documentation

## 2013-09-20 DIAGNOSIS — S0180XA Unspecified open wound of other part of head, initial encounter: Secondary | ICD-10-CM | POA: Insufficient documentation

## 2013-09-20 DIAGNOSIS — M25539 Pain in unspecified wrist: Secondary | ICD-10-CM | POA: Insufficient documentation

## 2013-09-20 DIAGNOSIS — Z87891 Personal history of nicotine dependence: Secondary | ICD-10-CM | POA: Insufficient documentation

## 2013-09-20 DIAGNOSIS — R52 Pain, unspecified: Secondary | ICD-10-CM | POA: Insufficient documentation

## 2013-09-20 DIAGNOSIS — R55 Syncope and collapse: Secondary | ICD-10-CM | POA: Insufficient documentation

## 2013-09-20 DIAGNOSIS — R079 Chest pain, unspecified: Secondary | ICD-10-CM | POA: Insufficient documentation

## 2013-09-20 LAB — POCT I-STAT, CHEM 8
BUN: 11 mg/dL (ref 6–23)
Calcium, Ion: 1.15 mmol/L (ref 1.12–1.23)
Chloride: 101 mEq/L (ref 96–112)
Creatinine, Ser: 0.7 mg/dL (ref 0.50–1.10)
Glucose, Bld: 117 mg/dL — ABNORMAL HIGH (ref 70–99)
HCT: 36 % (ref 36.0–46.0)
Hemoglobin: 12.2 g/dL (ref 12.0–15.0)
Potassium: 4 mEq/L (ref 3.5–5.1)
Sodium: 139 mEq/L (ref 135–145)
TCO2: 27 mmol/L (ref 0–100)

## 2013-09-20 LAB — POCT I-STAT TROPONIN I: Troponin i, poc: 0 ng/mL (ref 0.00–0.08)

## 2013-09-20 LAB — GLUCOSE, CAPILLARY: Glucose-Capillary: 109 mg/dL — ABNORMAL HIGH (ref 70–99)

## 2013-09-20 MED ORDER — METHOCARBAMOL 500 MG PO TABS: 500.0000 mg | ORAL_TABLET | Freq: Two times a day (BID) | ORAL | Status: DC

## 2013-09-20 MED ORDER — HYDROCODONE-ACETAMINOPHEN 5-325 MG PO TABS: 2.0000 | ORAL_TABLET | ORAL | Status: DC | PRN

## 2013-09-20 MED ORDER — ONDANSETRON HCL 4 MG/2ML IJ SOLN
4.0000 mg | Freq: Once | INTRAMUSCULAR | Status: AC
  Administered 2013-09-20: 4 mg via INTRAVENOUS
  Filled 2013-09-20: qty 2

## 2013-09-20 MED ORDER — FENTANYL CITRATE 0.05 MG/ML IJ SOLN
50.0000 ug | Freq: Once | INTRAMUSCULAR | Status: AC
  Administered 2013-09-20: 50 ug via INTRAVENOUS
  Filled 2013-09-20: qty 2

## 2013-09-20 MED ORDER — NAPROXEN 500 MG PO TABS: 500.0000 mg | ORAL_TABLET | Freq: Two times a day (BID) | ORAL | Status: DC

## 2013-09-20 MED ORDER — OXYCODONE-ACETAMINOPHEN 5-325 MG PO TABS: 1.0000 | ORAL_TABLET | Freq: Four times a day (QID) | ORAL | Status: DC | PRN

## 2013-09-20 MED ORDER — HYDROMORPHONE HCL PF 1 MG/ML IJ SOLN
1.0000 mg | Freq: Once | INTRAMUSCULAR | Status: AC
  Administered 2013-09-20: 1 mg via INTRAVENOUS
  Filled 2013-09-20: qty 1
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
## 2013-09-20 NOTE — ED Notes (Signed)
She remains in no distress; and has had her chin lac. Sutured by our N.P.  She c/o persistent pain in left arm, but our N.P. Explains that she will have pain in this extremity and she can take appropriate pain med at home.

## 2013-09-20 NOTE — ED Notes (Signed)
Bed: HQ46 Expected date: 09/20/13 Expected time: 12:51 PM Means of arrival: Ambulance Comments: Took too much pain med

## 2013-09-23 ENCOUNTER — Other Ambulatory Visit: Payer: Self-pay | Admitting: Orthopedic Surgery

## 2013-09-23 ENCOUNTER — Encounter (HOSPITAL_BASED_OUTPATIENT_CLINIC_OR_DEPARTMENT_OTHER): Payer: Self-pay | Admitting: *Deleted

## 2013-09-23 NOTE — Progress Notes (Signed)
No more labs needed- 

## 2013-09-25 ENCOUNTER — Encounter (HOSPITAL_BASED_OUTPATIENT_CLINIC_OR_DEPARTMENT_OTHER): Payer: BC Managed Care – PPO | Admitting: Anesthesiology

## 2013-09-25 ENCOUNTER — Ambulatory Visit (HOSPITAL_BASED_OUTPATIENT_CLINIC_OR_DEPARTMENT_OTHER): Payer: BC Managed Care – PPO | Admitting: Anesthesiology

## 2013-09-25 ENCOUNTER — Encounter (HOSPITAL_BASED_OUTPATIENT_CLINIC_OR_DEPARTMENT_OTHER): Payer: Self-pay

## 2013-09-25 ENCOUNTER — Encounter (HOSPITAL_BASED_OUTPATIENT_CLINIC_OR_DEPARTMENT_OTHER)
Admission: RE | Disposition: A | Discharge status: Home / Self Care | Payer: Self-pay | Source: Ambulatory Visit | Attending: Orthopedic Surgery

## 2013-09-25 ENCOUNTER — Ambulatory Visit (HOSPITAL_BASED_OUTPATIENT_CLINIC_OR_DEPARTMENT_OTHER)
Admission: RE | Admit: 2013-09-25 | Discharge: 2013-09-25 | Disposition: A | Discharge status: Home / Self Care | Payer: BC Managed Care – PPO | Source: Ambulatory Visit | Attending: Orthopedic Surgery | Admitting: Orthopedic Surgery

## 2013-09-25 DIAGNOSIS — Z87891 Personal history of nicotine dependence: Secondary | ICD-10-CM | POA: Insufficient documentation

## 2013-09-25 DIAGNOSIS — F988 Other specified behavioral and emotional disorders with onset usually occurring in childhood and adolescence: Secondary | ICD-10-CM | POA: Insufficient documentation

## 2013-09-25 DIAGNOSIS — S52599A Other fractures of lower end of unspecified radius, initial encounter for closed fracture: Secondary | ICD-10-CM | POA: Insufficient documentation

## 2013-09-25 DIAGNOSIS — W19XXXA Unspecified fall, initial encounter: Secondary | ICD-10-CM | POA: Insufficient documentation

## 2013-09-25 DIAGNOSIS — H8109 Meniere's disease, unspecified ear: Secondary | ICD-10-CM | POA: Insufficient documentation

## 2013-09-25 DIAGNOSIS — I050 Rheumatic mitral stenosis: Secondary | ICD-10-CM | POA: Insufficient documentation

## 2013-09-25 HISTORY — DX: Other specified postprocedural states: Z98.890

## 2013-09-25 HISTORY — DX: Nausea with vomiting, unspecified: R11.2

## 2013-09-25 HISTORY — DX: Presence of spectacles and contact lenses: Z97.3

## 2013-09-25 HISTORY — DX: Cardiac murmur, unspecified: R01.1

## 2013-09-25 HISTORY — DX: Other specified behavioral and emotional disorders with onset usually occurring in childhood and adolescence: F98.8

## 2013-09-25 HISTORY — DX: Family history of other specified conditions: Z84.89

## 2013-09-25 HISTORY — PX: OPEN REDUCTION INTERNAL FIXATION (ORIF) DISTAL RADIAL FRACTURE: SHX5989

## 2013-09-25 SURGERY — OPEN REDUCTION INTERNAL FIXATION (ORIF) DISTAL RADIUS FRACTURE
Anesthesia: General | Site: Wrist | Laterality: Left

## 2013-09-25 MED ORDER — BUPIVACAINE HCL (PF) 0.25 % IJ SOLN
INTRAMUSCULAR | Status: AC
  Filled 2013-09-25: qty 30

## 2013-09-25 MED ORDER — PROMETHAZINE HCL 25 MG/ML IJ SOLN
INTRAMUSCULAR | Status: AC
  Filled 2013-09-25: qty 1

## 2013-09-25 MED ORDER — HYDROMORPHONE HCL PF 1 MG/ML IJ SOLN: 0.2500 mg | INTRAMUSCULAR | Status: DC | PRN

## 2013-09-25 MED ORDER — MIDAZOLAM HCL 2 MG/2ML IJ SOLN
INTRAMUSCULAR | Status: AC
  Filled 2013-09-25: qty 2

## 2013-09-25 MED ORDER — FENTANYL CITRATE 0.05 MG/ML IJ SOLN
INTRAMUSCULAR | Status: AC
  Filled 2013-09-25: qty 2

## 2013-09-25 MED ORDER — DEXAMETHASONE SODIUM PHOSPHATE 10 MG/ML IJ SOLN
INTRAMUSCULAR | Status: DC | PRN
  Administered 2013-09-25: 10 mg via INTRAVENOUS

## 2013-09-25 MED ORDER — CEFAZOLIN SODIUM 1-5 GM-% IV SOLN
INTRAVENOUS | Status: AC
  Filled 2013-09-25: qty 100

## 2013-09-25 MED ORDER — ONDANSETRON HCL 4 MG/2ML IJ SOLN
INTRAMUSCULAR | Status: DC | PRN
  Administered 2013-09-25: 4 mg via INTRAVENOUS

## 2013-09-25 MED ORDER — FENTANYL CITRATE 0.05 MG/ML IJ SOLN
INTRAMUSCULAR | Status: AC
  Filled 2013-09-25: qty 6

## 2013-09-25 MED ORDER — PROPOFOL 10 MG/ML IV BOLUS
INTRAVENOUS | Status: DC | PRN
  Administered 2013-09-25: 200 mg via INTRAVENOUS

## 2013-09-25 MED ORDER — CHLORHEXIDINE GLUCONATE 4 % EX LIQD: 60.0000 mL | Freq: Once | CUTANEOUS | Status: DC

## 2013-09-25 MED ORDER — MIDAZOLAM HCL 2 MG/2ML IJ SOLN
1.0000 mg | INTRAMUSCULAR | Status: DC | PRN
  Administered 2013-09-25: 2 mg via INTRAVENOUS

## 2013-09-25 MED ORDER — ONDANSETRON HCL 4 MG/2ML IJ SOLN: 4.0000 mg | Freq: Once | INTRAMUSCULAR | Status: DC | PRN

## 2013-09-25 MED ORDER — OXYCODONE HCL 5 MG PO TABS: 5.0000 mg | ORAL_TABLET | Freq: Once | ORAL | Status: DC | PRN

## 2013-09-25 MED ORDER — OXYCODONE HCL 5 MG/5ML PO SOLN: 5.0000 mg | Freq: Once | ORAL | Status: DC | PRN

## 2013-09-25 MED ORDER — FENTANYL CITRATE 0.05 MG/ML IJ SOLN
50.0000 ug | INTRAMUSCULAR | Status: DC | PRN
  Administered 2013-09-25: 100 ug via INTRAVENOUS

## 2013-09-25 MED ORDER — SCOPOLAMINE 1 MG/3DAYS TD PT72
1.0000 | MEDICATED_PATCH | TRANSDERMAL | Status: DC
  Administered 2013-09-25: 1.5 mg via TRANSDERMAL

## 2013-09-25 MED ORDER — CEFAZOLIN SODIUM-DEXTROSE 2-3 GM-% IV SOLR
2.0000 g | INTRAVENOUS | Status: AC
  Administered 2013-09-25: 2 g via INTRAVENOUS

## 2013-09-25 MED ORDER — OXYCODONE-ACETAMINOPHEN 5-325 MG PO TABS: ORAL_TABLET | ORAL | Status: DC

## 2013-09-25 MED ORDER — BUPIVACAINE-EPINEPHRINE PF 0.5-1:200000 % IJ SOLN
INTRAMUSCULAR | Status: DC | PRN
  Administered 2013-09-25: 22 mL via PERINEURAL

## 2013-09-25 MED ORDER — LACTATED RINGERS IV SOLN
INTRAVENOUS | Status: DC
  Administered 2013-09-25 (×3): via INTRAVENOUS

## 2013-09-25 MED ORDER — LIDOCAINE HCL (CARDIAC) 20 MG/ML IV SOLN
INTRAVENOUS | Status: DC | PRN
  Administered 2013-09-25: 60 mg via INTRAVENOUS

## 2013-09-25 MED ORDER — SCOPOLAMINE 1 MG/3DAYS TD PT72
MEDICATED_PATCH | TRANSDERMAL | Status: AC
  Filled 2013-09-25: qty 1

## 2013-09-25 MED ORDER — LIDOCAINE HCL (PF) 1 % IJ SOLN
INTRAMUSCULAR | Status: AC
  Filled 2013-09-25: qty 5

## 2013-09-25 MED ORDER — PROMETHAZINE HCL 25 MG/ML IJ SOLN
6.2500 mg | Freq: Once | INTRAMUSCULAR | Status: AC
  Administered 2013-09-25: 6.25 mg via INTRAVENOUS

## 2013-09-25 SURGICAL SUPPLY — 68 items
BANDAGE ELASTIC 3 VELCRO ST LF (GAUZE/BANDAGES/DRESSINGS) ×2 IMPLANT
BIT DRILL 2.0 LNG QUCK RELEASE (BIT) IMPLANT
BIT DRILL 2.8X5 QR DISP (BIT) ×1 IMPLANT
BLADE MINI RND TIP GREEN BEAV (BLADE) IMPLANT
BLADE SURG 15 STRL LF DISP TIS (BLADE) ×2 IMPLANT
BLADE SURG 15 STRL SS (BLADE) ×4
BNDG CMPR 9X4 STRL LF SNTH (GAUZE/BANDAGES/DRESSINGS) ×1
BNDG ESMARK 4X9 LF (GAUZE/BANDAGES/DRESSINGS) ×2 IMPLANT
BNDG GAUZE ELAST 4 BULKY (GAUZE/BANDAGES/DRESSINGS) ×2 IMPLANT
CHLORAPREP W/TINT 26ML (MISCELLANEOUS) ×2 IMPLANT
CORDS BIPOLAR (ELECTRODE) ×2 IMPLANT
COVER MAYO STAND STRL (DRAPES) ×2 IMPLANT
COVER TABLE BACK 60X90 (DRAPES) ×2 IMPLANT
DRAPE EXTREMITY T 121X128X90 (DRAPE) ×2 IMPLANT
DRAPE OEC MINIVIEW 54X84 (DRAPES) ×3 IMPLANT
DRAPE SURG 17X23 STRL (DRAPES) ×2 IMPLANT
DRILL 2.0 LNG QUICK RELEASE (BIT) ×2
GAUZE XEROFORM 1X8 LF (GAUZE/BANDAGES/DRESSINGS) ×2 IMPLANT
GLOVE BIO SURGEON STRL SZ7.5 (GLOVE) ×2 IMPLANT
GLOVE BIOGEL PI IND STRL 7.0 (GLOVE) IMPLANT
GLOVE BIOGEL PI IND STRL 8 (GLOVE) ×1 IMPLANT
GLOVE BIOGEL PI IND STRL 8.5 (GLOVE) IMPLANT
GLOVE BIOGEL PI INDICATOR 7.0 (GLOVE) ×1
GLOVE BIOGEL PI INDICATOR 8 (GLOVE) ×1
GLOVE BIOGEL PI INDICATOR 8.5 (GLOVE) ×1
GLOVE ECLIPSE 7.0 STRL STRAW (GLOVE) ×1 IMPLANT
GLOVE EXAM NITRILE PF MED BLUE (GLOVE) ×1 IMPLANT
GLOVE SURG ORTHO 8.0 STRL STRW (GLOVE) ×1 IMPLANT
GOWN BRE IMP PREV XXLGXLNG (GOWN DISPOSABLE) ×3 IMPLANT
GOWN PREVENTION PLUS XLARGE (GOWN DISPOSABLE) ×2 IMPLANT
GUIDEWIRE ORTHO 0.054X6 (WIRE) ×3 IMPLANT
NDL HYPO 25X1 1.5 SAFETY (NEEDLE) IMPLANT
NEEDLE HYPO 25X1 1.5 SAFETY (NEEDLE) IMPLANT
NS IRRIG 1000ML POUR BTL (IV SOLUTION) ×2 IMPLANT
PACK BASIN DAY SURGERY FS (CUSTOM PROCEDURE TRAY) ×2 IMPLANT
PAD CAST 3X4 CTTN HI CHSV (CAST SUPPLIES) ×1 IMPLANT
PADDING CAST ABS 4INX4YD NS (CAST SUPPLIES) ×1
PADDING CAST ABS COTTON 4X4 ST (CAST SUPPLIES) ×1 IMPLANT
PADDING CAST COTTON 3X4 STRL (CAST SUPPLIES) ×2
PLATE LEFT DIST RADIUS NARROW (Plate) ×1 IMPLANT
SCREW ACTK 2 NL HEX 3.5.11 (Screw) ×1 IMPLANT
SCREW BONE HEX N/L 3.5X9 (Screw) IMPLANT
SCREW BONE HEX N/L 3.5X9MM (Screw) ×2 IMPLANT
SCREW CORT FT 18X2.3XLCK HEX (Screw) IMPLANT
SCREW CORT FT 20X2.3XLCK HEX (Screw) IMPLANT
SCREW CORTICAL LOCKING 2.3X16M (Screw) ×4 IMPLANT
SCREW CORTICAL LOCKING 2.3X18M (Screw) ×6 IMPLANT
SCREW CORTICAL LOCKING 2.3X20M (Screw) ×4 IMPLANT
SCREW FX16X2.3XLCK SMTH NS CRT (Screw) IMPLANT
SCREW FX18X2.3XSMTH LCK NS CRT (Screw) IMPLANT
SCREW FX20X2.3XSMTH LCK NS CRT (Screw) IMPLANT
SCREW NONLOCK HEX 3.5X12 (Screw) ×1 IMPLANT
SLEEVE SCD COMPRESS KNEE MED (MISCELLANEOUS) ×1 IMPLANT
SPLINT PLASTER CAST XFAST 4X15 (CAST SUPPLIES) IMPLANT
SPLINT PLASTER XTRA FAST SET 4 (CAST SUPPLIES) ×10
SPONGE GAUZE 4X4 12PLY (GAUZE/BANDAGES/DRESSINGS) ×2 IMPLANT
STOCKINETTE 4X48 STRL (DRAPES) ×2 IMPLANT
SUCTION FRAZIER TIP 10 FR DISP (SUCTIONS) IMPLANT
SUT ETHILON 3 0 PS 1 (SUTURE) IMPLANT
SUT ETHILON 4 0 PS 2 18 (SUTURE) ×2 IMPLANT
SUT VIC AB 3-0 PS1 18 (SUTURE)
SUT VIC AB 3-0 PS1 18XBRD (SUTURE) IMPLANT
SUT VICRYL 4-0 PS2 18IN ABS (SUTURE) ×2 IMPLANT
SYR BULB 3OZ (MISCELLANEOUS) ×2 IMPLANT
SYR CONTROL 10ML LL (SYRINGE) IMPLANT
TOWEL OR 17X24 6PK STRL BLUE (TOWEL DISPOSABLE) ×4 IMPLANT
TUBE CONNECTING 20X1/4 (TUBING) IMPLANT
UNDERPAD 30X30 INCONTINENT (UNDERPADS AND DIAPERS) ×2 IMPLANT

## 2013-09-25 NOTE — Anesthesia Preprocedure Evaluation (Signed)
Anesthesia Evaluation  Patient identified by MRN, date of birth, ID band Patient awake    Reviewed: Allergy & Precautions, H&P , NPO status , Patient's Chart, lab work & pertinent test results  History of Anesthesia Complications (+) PONV  Airway Mallampati: I TM Distance: >3 FB Neck ROM: Full    Dental  (+) Teeth Intact and Dental Advisory Given   Pulmonary former smoker,  breath sounds clear to auscultation        Cardiovascular Rhythm:Regular     Neuro/Psych    GI/Hepatic   Endo/Other    Renal/GU      Musculoskeletal   Abdominal   Peds  Hematology   Anesthesia Other Findings   Reproductive/Obstetrics                           Anesthesia Physical Anesthesia Plan  ASA: II  Anesthesia Plan: General   Post-op Pain Management:    Induction: Intravenous  Airway Management Planned: LMA  Additional Equipment:   Intra-op Plan:   Post-operative Plan: Extubation in OR  Informed Consent: I have reviewed the patients History and Physical, chart, labs and discussed the procedure including the risks, benefits and alternatives for the proposed anesthesia with the patient or authorized representative who has indicated his/her understanding and acceptance.   Dental advisory given  Plan Discussed with: CRNA, Anesthesiologist and Surgeon  Anesthesia Plan Comments:         Anesthesia Quick Evaluation

## 2013-09-25 NOTE — Progress Notes (Signed)
Assisted Dr. Crews with left, ultrasound guided, interscalene  block. Side rails up, monitors on throughout procedure. See vital signs in flow sheet. Tolerated Procedure well. 

## 2013-09-25 NOTE — Anesthesia Procedure Notes (Addendum)
Anesthesia Regional Block:  Supraclavicular block  Pre-Anesthetic Checklist: ,, timeout performed, Correct Patient, Correct Site, Correct Laterality, Correct Procedure, Correct Position, site marked, Risks and benefits discussed,  Surgical consent,  Pre-op evaluation,  At surgeon's request and post-op pain management  Laterality: Left and Upper  Prep: chloraprep       Needles:  Injection technique: Single-shot  Needle Type: Echogenic Stimulator Needle     Needle Length: 5cm 5 cm Needle Gauge: 21 and 21 G    Additional Needles:  Procedures: ultrasound guided (picture in chart) Supraclavicular block Narrative:  Start time: 09/25/2013 12:49 PM End time: 09/25/2013 12:45 PM Injection made incrementally with aspirations every 5 mL.  Performed by: Personally  Anesthesiologist: Sheldon Silvanavid Crews   Procedure Name: LMA Insertion Date/Time: 09/25/2013 1:21 PM Performed by: Tami RibasKUZMA, KEVIN R Pre-anesthesia Checklist: Patient identified, Emergency Drugs available, Suction available and Patient being monitored Patient Re-evaluated:Patient Re-evaluated prior to inductionOxygen Delivery Method: Circle System Utilized Preoxygenation: Pre-oxygenation with 100% oxygen Intubation Type: IV induction Ventilation: Mask ventilation without difficulty LMA: LMA inserted LMA Size: 3.0 Number of attempts: 1 Airway Equipment and Method: bite block Placement Confirmation: positive ETCO2 and breath sounds checked- equal and bilateral Tube secured with: Tape Dental Injury: Teeth and Oropharynx as per pre-operative assessment

## 2013-09-25 NOTE — Discharge Instructions (Addendum)
[facility] Instructions °Hand Surgery ° °Wound Care: °Keep your hand elevated above the level of your heart.  Do not allow it to dangle by your side.  Keep the dressing dry and do not remove it unless your doctor advises you to do so.  He will usually change it at the time of your post-op visit.  Moving your fingers is advised to stimulate circulation but will depend on the site of your surgery.  If you have a splint applied, your doctor will advise you regarding movement. ° °Activity: °Do not drive or operate machinery today.  Rest today and then you may return to your normal activity and work as indicated by your physician. ° °Diet:  °Drink liquids today or eat a light diet.  You may resume a regular diet tomorrow.   ° °General expectations: °Pain for two to three days. °Fingers may become slightly swollen. ° °Call your doctor if any of the following occur: °Severe pain not relieved by pain medication. °Elevated temperature. °Dressing soaked with blood. °Inability to move fingers. °White or bluish color to fingers. ° ° °Post Anesthesia Home Care Instructions ° °Activity: °Get plenty of rest for the remainder of the day. A responsible adult should stay with you for 24 hours following the procedure.  °For the next 24 hours, DO NOT: °-Drive a car °-Operate machinery °-Drink alcoholic beverages °-Take any medication unless instructed by your physician °-Make any legal decisions or sign important papers. ° °Meals: °Start with liquid foods such as gelatin or soup. Progress to regular foods as tolerated. Avoid greasy, spicy, heavy foods. If nausea and/or vomiting occur, drink only clear liquids until the nausea and/or vomiting subsides. Call your physician if vomiting continues. ° °Special Instructions/Symptoms: °Your throat may feel dry or sore from the anesthesia or the breathing tube placed in your throat during surgery. If this causes discomfort, gargle with warm salt water. The discomfort should disappear within 24  hours. ° ° °Regional Anesthesia Blocks ° °1. Numbness or the inability to move the "blocked" extremity may last from 3-48 hours after placement. The length of time depends on the medication injected and your individual response to the medication. If the numbness is not going away after 48 hours, call your surgeon. ° °2. The extremity that is blocked will need to be protected until the numbness is gone and the  Strength has returned. Because you cannot feel it, you will need to take extra care to avoid injury. Because it may be weak, you may have difficulty moving it or using it. You may not know what position it is in without looking at it while the block is in effect. ° °3. For blocks in the legs and feet, returning to weight bearing and walking needs to be done carefully. You will need to wait until the numbness is entirely gone and the strength has returned. You should be able to move your leg and foot normally before you try and bear weight or walk. You will need someone to be with you when you first try to ensure you do not fall and possibly risk injury. ° °4. Bruising and tenderness at the needle site are common side effects and will resolve in a few days. ° °5. Persistent numbness or new problems with movement should be communicated to the surgeon or the [facility] ([phone])/ [facility] ([phone]). °

## 2013-09-25 NOTE — Brief Op Note (Signed)
09/25/2013  2:32 PM  PATIENT:  Brianna Rhodes  53 y.o. female  PRE-OPERATIVE DIAGNOSIS:  LEFT DISTAL RADIUS FRACTURE   POST-OPERATIVE DIAGNOSIS:  LEFT DISTAL RADIUS FRACTURE  PROCEDURE:  Procedure(s): OPEN REDUCTION INTERNAL FIXATION (ORIF) LEFT DISTAL RADIUS FRACTURE (Left)  SURGEON:  Surgeon(s) and Role:    * Tami RibasKevin R Caitrin Pendergraph, MD - Primary    * Nicki ReaperGary R Tatem Holsonback, MD - Assisting  PHYSICIAN ASSISTANT:   ASSISTANTS: Cindee SaltGary Jacoya Bauman, MD   ANESTHESIA:   regional and general  EBL:  Total I/O In: 1000 [I.V.:1000] Out: -   BLOOD ADMINISTERED:none  DRAINS: none   LOCAL MEDICATIONS USED:  NONE  SPECIMEN:  No Specimen  DISPOSITION OF SPECIMEN:  N/A  COUNTS:  YES  TOURNIQUET:  * Missing tourniquet times found for documented tourniquets in log:  [id number] *  DICTATION: .Other Dictation: Dictation Number [id number]  PLAN OF CARE: Discharge to home after PACU  PATIENT DISPOSITION:  PACU - hemodynamically stable.

## 2013-09-25 NOTE — Transfer of Care (Signed)
Immediate Anesthesia Transfer of Care Note  Patient: Brianna Rhodes  Procedure(s) Performed: Procedure(s): OPEN REDUCTION INTERNAL FIXATION (ORIF) LEFT DISTAL RADIUS FRACTURE (Left)  Patient Location: PACU  Anesthesia Type:GA combined with regional for post-op pain  Level of Consciousness: awake and patient cooperative  Airway & Oxygen Therapy: Patient Spontanous Breathing and Patient connected to face mask oxygen  Post-op Assessment: Report given to PACU RN and Post -op Vital signs reviewed and stable  Post vital signs: Reviewed and stable  Complications: No apparent anesthesia complications

## 2013-09-25 NOTE — Anesthesia Postprocedure Evaluation (Signed)
  Anesthesia Post-op Note  Patient: Brianna Rhodes  Procedure(s) Performed: Procedure(s): OPEN REDUCTION INTERNAL FIXATION (ORIF) LEFT DISTAL RADIUS FRACTURE (Left)  Patient Location: PACU  Anesthesia Type:GA combined with regional for post-op pain  Level of Consciousness: awake, alert  and oriented  Airway and Oxygen Therapy: Patient Spontanous Breathing  Post-op Pain: none  Post-op Assessment: Post-op Vital signs reviewed  Post-op Vital Signs: Reviewed  Complications: No apparent anesthesia complications

## 2013-09-25 NOTE — H&P (Signed)
  Brianna Rhodes is an 53 y.o. female.   Chief Complaint: left distal radius fracture HPI: 53 yo rhd female fell from standing height 09/19/13 injuring left wrist.  [facility] performed under hematoma block.  Reports no previous injury to left wrist.  Laceration to head and chin.    Past Medical History  Diagnosis Date  . Meniere's disease   . PONV (postoperative nausea and vomiting)   . Heart murmur     mild MVP  . Wears glasses   . ADD (attention deficit disorder)   . Family history of anesthesia complication     father has n/v    Past Surgical History  Procedure Laterality Date  . Inguinal hernia repair  2008    left  . Endometrial ablation  2005  . Dilation and curettage of uterus  2005    History reviewed. No pertinent family history. Social History:  reports that she has quit smoking. She does not have any smokeless tobacco history on file. She reports that she uses illicit drugs (Marijuana). She reports that she does not drink alcohol.  Allergies:  Allergies  Allergen Reactions  . Percodan [Oxycodone-Aspirin] Nausea And Vomiting    Medications Prior to Admission  Medication Sig Dispense Refill  . amphetamine-dextroamphetamine (ADDERALL) 10 MG tablet Take 10 mg by mouth 4 (four) times daily as needed (for ADHD).      Marland Kitchen. HYDROcodone-acetaminophen (NORCO/VICODIN) 5-325 MG per tablet Take 2 tablets by mouth every 4 (four) hours as needed.  10 tablet  0  . naproxen (NAPROSYN) 500 MG tablet Take 1 tablet (500 mg total) by mouth 2 (two) times daily with a meal.  20 tablet  0  . triamcinolone (NASACORT) 55 MCG/ACT AERO nasal inhaler Place 2 sprays into the nose daily.      . methocarbamol (ROBAXIN) 500 MG tablet Take 1 tablet (500 mg total) by mouth 2 (two) times daily.  20 tablet  0  . OVER THE COUNTER MEDICATION Place 1 drop into both eyes daily as needed (for dry eys).        No results found for this or any previous visit (from the past 48  hour(s)).  No results found.   A comprehensive review of systems was negative.  Blood pressure 141/90, pulse 83, temperature 98.1 F (36.7 C), temperature source Oral, resp. rate 20, height 5\' 6"  (1.676 m), weight 128 lb (58.06 kg), SpO2 100.00%.  General appearance: alert, cooperative and appears stated age Head: Normocephalic, without obvious abnormality, atraumatic Neck: supple, symmetrical, trachea midline Resp: clear to auscultation bilaterally Cardio: regular rate and rhythm GI: non tender Extremities: intact sensation and capillary refill all digits.  +epl/fpl/io.  no wounds.   Pulses: 2+ and symmetric Skin: Skin color, texture, turgor normal. No rashes or lesions Neurologic: Grossly normal Incision/Wound: none  Assessment/Plan Left distal radius fracture.  Non operative and operative treatment options were discussed with the patient and patient wishes to proceed with operative treatment. Risks, benefits, and alternatives of surgery were discussed and the patient agrees with the plan of care.   Brianna Rhodes R 09/25/2013, 12:05 PM

## 2013-09-26 NOTE — Op Note (Signed)
[name], [name]:  [id number]  MEDICAL RECORD NO.:  [id number]  LOCATION:                                 FACILITY:  PHYSICIAN:  Betha Loa, MD        DATE OF BIRTH:  04-Feb-1961  DATE OF PROCEDURE:  09/25/2013 DATE OF DISCHARGE:                              OPERATIVE REPORT   PREOPERATIVE DIAGNOSIS:  Left distal radius fracture.  POSTOPERATIVE DIAGNOSIS:  Left distal radius fracture.  PROCEDURE:  Open reduction and internal fixation, left distal radius fracture.  SURGEON:  Betha Loa, MD  ASSISTANTS:  None.  ANESTHESIA:  General with regional.  IV FLUIDS:  Per Anesthesia flow sheet.  ESTIMATED BLOOD LOSS:  Minimal.  COMPLICATIONS:  None.  SPECIMENS:  None.  TOURNIQUET TIME:  53 minutes.  DISPOSITION:  Stable to PACU.  INDICATIONS:  Brianna Rhodes who fell six days ago, injuring her left wrist.  She was seen in the [facility] Emergency Department where a closed reduction was performed.  She followed up in the office.  We discussed nonoperative and operative treatment options. She wished to proceed with operative fixation.  Risks, benefits, and alternatives of surgery were discussed including the risk of blood loss; infection; damage to nerves, vessels, tendons, ligaments, bone; failure of surgery; need for additional surgery; complications with wound healing; continued pain; nonunion; malunion; stiffness.  She voiced understanding of these risks and elected to proceed.  OPERATIVE COURSE:  After being identified preoperatively by myself, the patient and I agreed upon the procedure and site of the procedure. Surgical site was marked.  The risks, benefits, and alternatives of surgery were reviewed and she wished to proceed.  Surgical consent had been signed.  She was given IV Ancef as preoperative antibiotic prophylaxis.  She was transferred to the operating room and placed on the operating table in supine position  with the left upper extremity on arm board.  Regional block had been performed by Anesthesia in preoperative holding area.  General anesthesia was induced in the operating room.  Left upper extremity was prepped and draped in normal sterile orthopedic fashion.  Surgical pause was performed between surgeons, Anesthesia, and operating staff, and all were in agreement as to the patient, procedure, and site of the procedure.  Tourniquet at the proximal aspect of the extremity was inflated to 250 mmHg after exsanguination of the limb with an Esmarch bandage.  Standard volar Sherilyn Cooter approach was used.  Spreading technique was used in the subcutaneous tissues.  Bipolar electrocautery was used to obtain hemostasis.  The superficial and deep portions of the FCR tendon sheath were incised and the FCR and FPL swept ulnarly to protect the palmar cutaneous branch of the median nerve.  The palmar cutaneous branch was visualized and was protected.  The brachioradialis was identified and released from the radial side of the radius.  The pronator quadratus was released and elevated with periosteal elevator.  The fracture site was easily identified.  It was cleared of soft tissue interposition.  A volar distal radial locking plate from the Acumed set was selected and secured to the bone using the guide pins.  The reduction plate were adjusted until appropriate fit had been obtained.  A nonlocking screw was placed in a side hole in the shaft of the plate.  The distal holes were filled with locking pegs with the exception of locking screws in the radial styloid holes.  Standard AO drilling and measuring technique was used throughout.  The remaining two holes in the shaft of the plate were filled with nonlocking screws.  Good purchase was obtained.  The C- arm was used in AP, lateral, and oblique projections to ensure appropriate reduction, position of hardware, which was the case.  There was no intra-articular  penetration.  The wound was copiously irrigated with sterile saline.  The pronator quadratus was repaired back over top of the plate using 4-0 Vicryl suture.  The Vicryl suture was used in inverted interrupted fashion in the subcutaneous tissues and the skin was closed with 4-0 nylon in a horizontal mattress fashion.  The wound was dressed with sterile Xeroform, 4x4s, and wrapped with Kerlix and Ace.  The forearm was placed through a range of motion.  The distal radioulnar joint was stable throughout.  A volar splint was placed and wrapped with Kerlix and Ace bandage.  The tourniquet was deflated at 53 minutes.  Fingertips were pink with brisk capillary refill after deflation of tourniquet.  The operative drapes were broken down and the patient was awoken from anesthesia safely.  She was transferred back to stretcher and taken to the PACU in stable condition.  I will see her back in the office in 1 week for postoperative followup.  I will give her Percocet 5/325, 1 to 2 p.o. q.6 hours p.r.n. pain, dispensed #40.     Betha LoaKevin Tiari Andringa, MD     KK/MEDQ  D:  09/25/2013  T:  09/25/2013  Job:  [id number]

## 2013-09-28 LAB — POCT HEMOGLOBIN-HEMACUE: Hemoglobin: 13.9 g/dL (ref 12.0–15.0)

## 2013-09-28 NOTE — ED Provider Notes (Signed)
Medical screening examination/treatment/procedure(s) were performed by non-physician practitioner and as supervising physician I was immediately available for consultation/collaboration.  EKG Interpretation    Date/Time:  Sunday September 20 2013 13:07:31 EST Ventricular Rate:  57 PR Interval:  118 QRS Duration: 119 QT Interval:  463 QTC Calculation: 451 R Axis:   57 Text Interpretation:  Sinus rhythm Borderline short PR interval Nonspecific intraventricular conduction delay ED PHYSICIAN INTERPRETATION AVAILABLE IN [facility] Confirmed by TEST, RECORD ([id number]) on 09/22/2013 11:34:54 AM              Gavin PoundMichael Y. Keric Zehren, MD 09/28/13 1530

## 2013-09-29 ENCOUNTER — Encounter (HOSPITAL_BASED_OUTPATIENT_CLINIC_OR_DEPARTMENT_OTHER): Payer: Self-pay | Admitting: Orthopedic Surgery

## 2013-12-11 ENCOUNTER — Encounter: Payer: Self-pay | Admitting: Diagnostic Neuroimaging

## 2013-12-11 ENCOUNTER — Encounter (INDEPENDENT_AMBULATORY_CARE_PROVIDER_SITE_OTHER): Payer: Self-pay

## 2013-12-11 ENCOUNTER — Ambulatory Visit (INDEPENDENT_AMBULATORY_CARE_PROVIDER_SITE_OTHER): Payer: BC Managed Care – PPO | Admitting: Diagnostic Neuroimaging

## 2013-12-11 VITALS — BP 114/79 | HR 89 | Temp 98.0°F | Ht 65.5 in | Wt 130.5 lb

## 2013-12-11 DIAGNOSIS — IMO0002 Reserved for concepts with insufficient information to code with codable children: Secondary | ICD-10-CM

## 2013-12-11 DIAGNOSIS — G9050 Complex regional pain syndrome I, unspecified: Secondary | ICD-10-CM

## 2013-12-11 DIAGNOSIS — G5612 Other lesions of median nerve, left upper limb: Secondary | ICD-10-CM

## 2013-12-11 DIAGNOSIS — G5610 Other lesions of median nerve, unspecified upper limb: Secondary | ICD-10-CM

## 2013-12-11 NOTE — Progress Notes (Signed)
[facility]  PATIENT: Brianna Rhodes DOB: [birth year]  REFERRING CLINICIAN: Karlyn Agee HISTORY FROM: patient  REASON FOR VISIT: new consult   HISTORICAL  CHIEF COMPLAINT:  Chief Complaint  Patient presents with  . Wrist Problem  . Eye Problem    HISTORY OF PRESENT ILLNESS:   53 year old right-handed female with history of Mnire's disease, here for evaluation of post concussion syndrome and left hand numbness and pain.  09/18/13 patient was riding a skateboard, slipped and fell off. She landed on her left hand resulting in left radius fracture. She had severe pain and swelling. Patient went to the bathroom, developed nausea, severe pain and passed out. She struck her head on the ground, resulting in laceration with the emergency room. These bed laceration repair, evaluation and was discharged home. The next day patient woke up went to the bathroom a staggering had loss of consciousness again. This time she struck her chin on the ground and went to the emergency room for evaluation. Patient developed amnesia for both of these events. Within a week she had surgical repair of of her left forearm fracture. She underwent physical therapy occupational therapy for several months and has continued today.  Patient was referred for postconcussion evaluation at the end of December however those symptoms have essentially resolved. Now patient is more concerned about numbness and pain in her left hand digits 1-3, with color change (blue, white, red), numbness in the fingers and decreased range of motion. Patient was diagnosed with possible complex regional pain syndrome. She's been taking gabapentin 300 mg once at bedtime. She tried taking it 3 times a day but felt "strange". She tried stopping medication and her pain returned. Patient is reluctant to try narcotic medications. Patient is concerned about her ability to use her left hand, and he wonders when her functioning will return.  Patient plays guitar, and is having difficulty with this.  REVIEW OF SYSTEMS: Full 14 system review of systems performed and notable only for only as per history of present illness.  ALLERGIES: Allergies  Allergen Reactions  . Codeine   . Percodan [Oxycodone-Aspirin] Nausea And Vomiting    HOME MEDICATIONS: Outpatient Prescriptions Prior to Visit  Medication Sig Dispense Refill  . amphetamine-dextroamphetamine (ADDERALL) 10 MG tablet Take 10 mg by mouth 4 (four) times daily as needed (for ADHD).      Marland Kitchen methocarbamol (ROBAXIN) 500 MG tablet Take 1 tablet (500 mg total) by mouth 2 (two) times daily.  20 tablet  0  . naproxen (NAPROSYN) 500 MG tablet Take 1 tablet (500 mg total) by mouth 2 (two) times daily with a meal.  20 tablet  0  . OVER THE COUNTER MEDICATION Place 1 drop into both eyes daily as needed (for dry eys).      Marland Kitchen oxyCODONE-acetaminophen (PERCOCET) 5-325 MG per tablet 1-2 tabs po q6 hours prn pain  40 tablet  0  . triamcinolone (NASACORT) 55 MCG/ACT AERO nasal inhaler Place 2 sprays into the nose daily.       No facility-administered medications prior to visit.    PAST MEDICAL HISTORY: Past Medical History  Diagnosis Date  . Meniere's disease   . PONV (postoperative nausea and vomiting)   . Heart murmur     mild MVP  . Wears glasses   . ADD (attention deficit disorder)   . Family history of anesthesia complication     father has n/v    PAST SURGICAL HISTORY: Past Surgical History  Procedure Laterality  Date  . Inguinal hernia repair  2008    left  . Endometrial ablation  2005  . Dilation and curettage of uterus  2005  . Open reduction internal fixation (orif) distal radial fracture Left 09/25/2013    Procedure: OPEN REDUCTION INTERNAL FIXATION (ORIF) LEFT DISTAL RADIUS FRACTURE;  Surgeon: Tami RibasKevin R Kuzma, MD;  Location: [facility];  Service: Orthopedics;  Laterality: Left;    FAMILY HISTORY: Family History  Problem Relation Age of Onset  .  Heart Problems Mother   . Meniere's disease Mother   . Heart Problems Father     SOCIAL HISTORY:  History   Social History  . Marital Status: Single    Spouse Name: N/A    Number of Children: 3  . Years of Education: BA   Occupational History  .      [employer]   Social History Main Topics  . Smoking status: Never Smoker   . Smokeless tobacco: Never Used  . Alcohol Use: No     Comment: quit in 1988  . Drug Use: Yes    Special: Marijuana     Comment: not in 3 months-9/14  . Sexual Activity: Not on file   Other Topics Concern  . Not on file   Social History Narrative   Patient lives at home with her children.   Caffeine Use: 2-3 cups daily     PHYSICAL EXAM  Filed Vitals:   12/11/13 0856  BP: 114/79  Pulse: 89  Temp: 98 F (36.7 C)  TempSrc: Oral  Height: 5' 5.5" (1.664 m)  Weight: 130 lb 8 oz (59.194 kg)    Not recorded    Body mass index is 21.38 kg/(m^2).  GENERAL EXAM: Patient is in no distress; well developed, nourished and groomed; neck is supple  CARDIOVASCULAR: Regular rate and rhythm, no murmurs, no carotid bruits  NEUROLOGIC: MENTAL STATUS: awake, alert, oriented to person, place and time, recent and remote memory intact, normal attention and concentration, language fluent, comprehension intact, naming intact, fund of knowledge appropriate CRANIAL NERVE: no papilledema on fundoscopic exam, pupils equal and reactive to light, visual fields full to confrontation, extraocular muscles intact, no nystagmus, facial sensation and strength symmetric, hearing intact, palate elevates symmetrically, uvula midline, shoulder shrug symmetric, tongue midline. MOTOR: RIGHT APB ATROPHY AND WEAKNESS. LEFT APB WEAKNESS, FINGER FLEX (DIITS 2,3) WEAKNESS. DECR PASSIVE ROM IN THUMB AND FINGER FLEXION. Normal bulk and tone, full strength in the BUE, BLE. SWELLING/DEFORMITY OF LEFT FOREARM, WITH POST-SURGICAL CHANGES. SENSORY: normal and symmetric to light touch,  temperature, vibration; EXCEPT DECR PP IN LEFT HAND (MEDIAN DISTRIBUTION) COORDINATION: finger-nose-finger, fine finger movements normal REFLEXES: deep tendon reflexes present and symmetric GAIT/STATION: narrow based gait; able to walk on toes, heels and tandem; romberg is negative    DIAGNOSTIC DATA (LABS, IMAGING, TESTING) - I reviewed patient records, labs, notes, testing and imaging myself where available.  Lab Results  Component Value Date   WBC 16.6* 09/19/2013   HGB 13.9 09/25/2013   HCT 36.0 09/20/2013   MCV 89.8 09/19/2013   PLT 159 09/19/2013      Component Value Date/Time   NA 139 09/20/2013 1643   K 4.0 09/20/2013 1643   CL 101 09/20/2013 1643   CO2 26 09/19/2013 1603   GLUCOSE 117* 09/20/2013 1643   BUN 11 09/20/2013 1643   CREATININE 0.70 09/20/2013 1643   CALCIUM 9.0 09/19/2013 1603   PROT 6.7 09/19/2013 1603   ALBUMIN 4.3 09/19/2013 1603  AST 26 09/19/2013 1603   ALT 18 09/19/2013 1603   ALKPHOS 86 09/19/2013 1603   BILITOT 0.6 09/19/2013 1603   GFRNONAA 81* 09/19/2013 1603   GFRAA >90 09/19/2013 1603   No results found for this basename: CHOL, HDL, LDLCALC, LDLDIRECT, TRIG, CHOLHDL   No results found for this basename: HGBA1C   No results found for this basename: VITAMINB12   No results found for this basename: TSH    CT HEAD - normal   ASSESSMENT AND PLAN  53 y.o. year old female here with traumatic left radius fracture status post surgery, with concussion and postconcussion symptoms for several weeks. Now with residual numbness and pain in the left hand, digits 1-3, in the median nerve distribution. Likely represents complex regional pain syndrome (TYPE 2) as sequelae of trauma.  Ddx: CRPS +/- left median neuropathy  PLAN: - Start Multivitamin. - Continue physical/occupational therapy exercises. - Continue gabapentin 300mg  three times per day for next 1-3 months. - NCV/EMG  Orders Placed This Encounter  Procedures  . NCV with  EMG(electromyography)   Return in about 3 months (around 03/13/2014).   Suanne Marker, MD 12/11/2013, 10:02 AM Certified in Neurology, Neurophysiology and Neuroimaging  [facility] [address] [place], [place] [address] [phone]

## 2013-12-11 NOTE — Patient Instructions (Signed)
Start Multivitamin.  Continue physical/occupational therapy exercises.  Continue gabapentin 300mg  three times per day for next 1-3 months.

## 2013-12-15 ENCOUNTER — Telehealth: Payer: Self-pay | Admitting: Diagnostic Neuroimaging

## 2013-12-15 NOTE — Telephone Encounter (Addendum)
Spoke with patient and she said that Dr Marjory LiesPenumalli had explained to her during the LOV that she did not have to have the NCV/EMG done because the nerve would heal on its own.

## 2013-12-15 NOTE — Telephone Encounter (Signed)
Spoke to patient. Advised patient she does not have the NCV/EMG if she does not want to per Dr. Marjory LiesPenumalli. Patient agreed.

## 2013-12-15 NOTE — Telephone Encounter (Signed)
Called patient to schedule nerve conduction and EMG test but patient states Brianna Rhodes was under the impression that Dr. Marjory LiesPenumalli decided not to have her do that test. Please call patient and advise.

## 2014-03-15 ENCOUNTER — Ambulatory Visit: Payer: BC Managed Care – PPO | Admitting: Diagnostic Neuroimaging

## 2016-02-23 ENCOUNTER — Ambulatory Visit (INDEPENDENT_AMBULATORY_CARE_PROVIDER_SITE_OTHER): Payer: BLUE CROSS/BLUE SHIELD

## 2016-02-23 ENCOUNTER — Ambulatory Visit (INDEPENDENT_AMBULATORY_CARE_PROVIDER_SITE_OTHER): Payer: BLUE CROSS/BLUE SHIELD | Admitting: Urgent Care

## 2016-02-23 VITALS — BP 108/64 | HR 71 | Temp 98.0°F | Resp 16 | Ht 65.0 in | Wt 127.0 lb

## 2016-02-23 DIAGNOSIS — M25522 Pain in left elbow: Secondary | ICD-10-CM

## 2016-02-23 DIAGNOSIS — S59902A Unspecified injury of left elbow, initial encounter: Secondary | ICD-10-CM

## 2016-02-23 DIAGNOSIS — M25422 Effusion, left elbow: Secondary | ICD-10-CM

## 2016-02-23 DIAGNOSIS — S51012A Laceration without foreign body of left elbow, initial encounter: Secondary | ICD-10-CM

## 2016-02-23 MED ORDER — KETOROLAC TROMETHAMINE 60 MG/2ML IM SOLN
60.0000 mg | Freq: Once | INTRAMUSCULAR | Status: AC
  Administered 2016-02-23: 60 mg via INTRAMUSCULAR

## 2016-02-23 MED ORDER — NAPROXEN SODIUM 550 MG PO TABS: 550.0000 mg | ORAL_TABLET | Freq: Two times a day (BID) | ORAL | Status: DC

## 2016-02-23 MED ORDER — CEPHALEXIN 500 MG PO CAPS: 500.0000 mg | ORAL_CAPSULE | Freq: Three times a day (TID) | ORAL | Status: DC

## 2016-02-23 NOTE — Patient Instructions (Addendum)
Laceration Care, Adult A laceration is a cut that goes through all of the layers of the skin and into the tissue that is right under the skin. Some lacerations heal on their own. Others need to be closed with stitches (sutures), staples, skin adhesive strips, or skin glue. Proper laceration care minimizes the risk of infection and helps the laceration to heal better. HOW TO CARE FOR YOUR LACERATION If sutures or staples were used:  Keep the wound clean and dry.  If you were given a bandage (dressing), you should change it at least one time per day or as told by your health care provider. You should also change it if it becomes wet or dirty.  Keep the wound completely dry for the first 24 hours or as told by your health care provider. After that time, you may shower or bathe. However, make sure that the wound is not soaked in water until after the sutures or staples have been removed.  Clean the wound one time each day or as told by your health care provider:  Wash the wound with soap and water.  Rinse the wound with water to remove all soap.  Pat the wound dry with a clean towel. Do not rub the wound.  After cleaning the wound, apply a thin layer of antibiotic ointmentas told by your health care provider. This will help to prevent infection and keep the dressing from sticking to the wound.  Have the sutures or staples removed as told by your health care provider. If skin adhesive strips were used:  Keep the wound clean and dry.  If you were given a bandage (dressing), you should change it at least one time per day or as told by your health care provider. You should also change it if it becomes dirty or wet.  Do not get the skin adhesive strips wet. You may shower or bathe, but be careful to keep the wound dry.  If the wound gets wet, pat it dry with a clean towel. Do not rub the wound.  Skin adhesive strips fall off on their own. You may trim the strips as the wound heals. Do not  remove skin adhesive strips that are still stuck to the wound. They will fall off in time. If skin glue was used:  Try to keep the wound dry, but you may briefly wet it in the shower or bath. Do not soak the wound in water, such as by swimming.  After you have showered or bathed, gently pat the wound dry with a clean towel. Do not rub the wound.  Do not do any activities that will make you sweat heavily until the skin glue has fallen off on its own.  Do not apply liquid, cream, or ointment medicine to the wound while the skin glue is in place. Using those may loosen the film before the wound has healed.  If you were given a bandage (dressing), you should change it at least one time per day or as told by your health care provider. You should also change it if it becomes dirty or wet.  If a dressing is placed over the wound, be careful not to apply tape directly over the skin glue. Doing that may cause the glue to be pulled off before the wound has healed.  Do not pick at the glue. The skin glue usually remains in place for 5-10 days, then it falls off of the skin. General Instructions  Take over-the-counter and prescription   medicines only as told by your health care provider.  If you were prescribed an antibiotic medicine or ointment, take or apply it as told by your doctor. Do not stop using it even if your condition improves.  To help prevent scarring, make sure to cover your wound with sunscreen whenever you are outside after stitches are removed, after adhesive strips are removed, or when glue remains in place and the wound is healed. Make sure to wear a sunscreen of at least 30 SPF.  Do not scratch or pick at the wound.  Keep all follow-up visits as told by your health care provider. This is important.  Check your wound every day for signs of infection. Watch for:  Redness, swelling, or pain.  Fluid, blood, or pus.  Raise (elevate) the injured area above the level of your heart  while you are sitting or lying down, if possible. SEEK MEDICAL CARE IF:  You received a tetanus shot and you have swelling, severe pain, redness, or bleeding at the injection site.  You have a fever.  A wound that was closed breaks open.  You notice a bad smell coming from your wound or your dressing.  You notice something coming out of the wound, such as wood or glass.  Your pain is not controlled with medicine.  You have increased redness, swelling, or pain at the site of your wound.  You have fluid, blood, or pus coming from your wound.  You notice a change in the color of your skin near your wound.  You need to change the dressing frequently due to fluid, blood, or pus draining from the wound.  You develop a new rash.  You develop numbness around the wound. SEEK IMMEDIATE MEDICAL CARE IF:  You develop severe swelling around the wound.  Your pain suddenly increases and is severe.  You develop painful lumps near the wound or on skin that is anywhere on your body.  You have a red streak going away from your wound.  The wound is on your hand or foot and you cannot properly move a finger or toe.  The wound is on your hand or foot and you notice that your fingers or toes look pale or bluish.   This information is not intended to replace advice given to you by your health care provider. Make sure you discuss any questions you have with your health care provider.   Document Released: 09/10/2005 Document Revised: 01/25/2015 Document Reviewed: 09/06/2014 Elsevier Interactive Patient Education 2016 [employer].     IF you received an x-ray today, you will receive an invoice from [facility]. Please contact [facility] at [phone] with questions or concerns regarding your invoice.   IF you received labwork today, you will receive an invoice from [employer]. Please contact [facility] at [phone] with questions or concerns  regarding your invoice.   Our billing staff will not be able to assist you with questions regarding bills from these companies.  You will be contacted with the lab results as soon as they are available. The fastest way to get your results is to activate your My Chart account. Instructions are located on the last page of this paperwork. If you have not heard from [place] regarding the results in 2 weeks, please contact this office.     We recommend that you schedule a mammogram for breast cancer screening. Typically, you do not need a referral to do this. Please contact a local imaging center to  schedule your mammogram.  [facility] - [phone]  *ask for the Radiology Department [facility]) - [phone]) [phone] or [phone]) [phone]  [facility] - [phone] [facility] - [phone] [facility] - [phone]) [phone]  *ask for the Radiology Department [facility] - [phone](336) [phone]  *ask for the Radiology Department [facility] - [phone]  *ask for the Mammography Department [facility] - [phone]) [phone]

## 2016-02-23 NOTE — Progress Notes (Signed)
MRN: [id number] DOB: [birth year]  Subjective:   Brianna Rhodes is a 55 y.o. female presenting for chief complaint of Elbow Injury  Patient suffered a fall from steps today. She landed on wooden floor and broke her fall with her left elbow. Also made impact with her butt and back of her right head. She reports a laceration over the back of her left elbow. Has since had worsening throbbing elbow pain. She wrapped her elbow with gauze and coband and presented here. Currently she admits headache form her elbow pain. Denies confusion, loc from her fall, numbness or tingling, loss of ROM. Last TDAP in 2014.  Brianna Rhodes has a current medication list which includes the following prescription(s): amphetamine-dextroamphetamine and gabapentin, and the following Facility-Administered Medications: ketorolac. Also is allergic to codeine and percodan.  Brianna Rhodes  has a past medical history of Meniere's disease; PONV (postoperative nausea and vomiting); Heart murmur; Wears glasses; ADD (attention deficit disorder); and Family history of anesthesia complication. Also  has past surgical history that includes Inguinal hernia repair (2008); Endometrial ablation (2005); Dilation and curettage of uterus (2005); and Open reduction internal fixation (orif) distal radial fracture (Left, 09/25/2013).  Her family history includes Heart Problems in her father and mother; Meniere's disease in her mother.  Objective:   Vitals: BP 108/64 mmHg  Pulse 71  Temp(Src) 98 F (36.7 C)  Resp 16  Ht 5\' 5"  (1.651 m)  Wt 127 lb (57.607 kg)  BMI 21.13 kg/m2  SpO2 99%  Physical Exam  Constitutional: She is oriented to person, place, and time. She appears well-developed and well-nourished.  Cardiovascular: Normal rate.   Pulmonary/Chest: Effort normal.  Musculoskeletal:       Left elbow: She exhibits swelling (over wound) and laceration. She exhibits normal range of motion, no effusion and no deformity. Tenderness found. Olecranon  process tenderness noted. No radial head, no medial epicondyle and no lateral epicondyle tenderness noted.       Arms: Full ROM, Strength 5/5 throughout.  Neurological: She is alert and oriented to person, place, and time. She has normal reflexes. No cranial nerve deficit.  Skin: Skin is warm and dry.   Dg Elbow Complete Left  02/23/2016  CLINICAL DATA:  Status post fall down stairs today with a blow to the left elbow. Laceration on the posterior aspect of the left elbow. Initial encounter. EXAM: LEFT ELBOW - COMPLETE 3+ VIEW COMPARISON:  Plain films left forearm 09/19/2013. FINDINGS: Soft tissue swelling is seen over the posterior aspect of the elbow. No radiopaque foreign body is identified. There is no elbow joint effusion. No fracture or dislocation. IMPRESSION: Soft tissue swelling posterior left elbow.  Otherwise negative. Electronically Signed   By: Drusilla Kannerhomas  Dalessio M.D.   On: 02/23/2016 17:44    PROCEDURE NOTE: laceration repair Verbal consent obtained from patient.  Local anesthesia with 3cc Lidocaine 2% with epinephrine.  Wound explored for tendon, ligament damage. Wound scrubbed with soap and water and rinsed. Wound closed with #4 4-0 Prolene 6 (3 horizontal mattress, 3 simple interrupted) sutures.  Wound cleansed and dressed.  Assessment and Plan :   1. Elbow laceration, left, initial encounter 2. Elbow injury, left, initial encounter 3. Elbow pain, left 4. Elbow swelling, left - Start Keflex to cover for risk of infection. Laceration care reviewed. Patient is to rest from sports activities for 2 weeks. She is to rtc if she develops signs and symptoms of infection as discussed in clinic.    Wallis BambergMario Tramain Gershman, PA-C [facility] [phone] 02/23/2016 5:27 PM

## 2018-12-26 ENCOUNTER — Observation Stay (HOSPITAL_COMMUNITY): Payer: BLUE CROSS/BLUE SHIELD

## 2018-12-26 ENCOUNTER — Emergency Department (HOSPITAL_COMMUNITY): Payer: BLUE CROSS/BLUE SHIELD

## 2018-12-26 ENCOUNTER — Other Ambulatory Visit: Payer: Self-pay

## 2018-12-26 ENCOUNTER — Inpatient Hospital Stay (HOSPITAL_COMMUNITY)
Admission: EM | Admit: 2018-12-26 | Discharge: 2018-12-29 | DRG: 494 | Disposition: A | Discharge status: Home / Self Care | Payer: BLUE CROSS/BLUE SHIELD | Attending: Orthopaedic Surgery | Admitting: Orthopaedic Surgery

## 2018-12-26 ENCOUNTER — Encounter (HOSPITAL_COMMUNITY): Payer: Self-pay

## 2018-12-26 DIAGNOSIS — Z419 Encounter for procedure for purposes other than remedying health state, unspecified: Secondary | ICD-10-CM

## 2018-12-26 DIAGNOSIS — Z09 Encounter for follow-up examination after completed treatment for conditions other than malignant neoplasm: Secondary | ICD-10-CM

## 2018-12-26 DIAGNOSIS — S82252A Displaced comminuted fracture of shaft of left tibia, initial encounter for closed fracture: Secondary | ICD-10-CM

## 2018-12-26 DIAGNOSIS — S82392A Other fracture of lower end of left tibia, initial encounter for closed fracture: Secondary | ICD-10-CM | POA: Diagnosis not present

## 2018-12-26 DIAGNOSIS — S82462A Displaced segmental fracture of shaft of left fibula, initial encounter for closed fracture: Secondary | ICD-10-CM | POA: Diagnosis present

## 2018-12-26 DIAGNOSIS — S82832A Other fracture of upper and lower end of left fibula, initial encounter for closed fracture: Secondary | ICD-10-CM

## 2018-12-26 DIAGNOSIS — Y9355 Activity, bike riding: Secondary | ICD-10-CM

## 2018-12-26 DIAGNOSIS — S82202A Unspecified fracture of shaft of left tibia, initial encounter for closed fracture: Secondary | ICD-10-CM | POA: Diagnosis present

## 2018-12-26 LAB — CBC
HCT: 38.7 % (ref 36.0–46.0)
Hemoglobin: 12.9 g/dL (ref 12.0–15.0)
MCH: 30.3 pg (ref 26.0–34.0)
MCHC: 33.3 g/dL (ref 30.0–36.0)
MCV: 90.8 fL (ref 80.0–100.0)
Platelets: 190 10*3/uL (ref 150–400)
RBC: 4.26 MIL/uL (ref 3.87–5.11)
RDW: 12.5 % (ref 11.5–15.5)
WBC: 12.7 10*3/uL — ABNORMAL HIGH (ref 4.0–10.5)
nRBC: 0 % (ref 0.0–0.2)

## 2018-12-26 LAB — ABO/RH: ABO/RH(D): O POS

## 2018-12-26 LAB — BASIC METABOLIC PANEL
Anion gap: 9 (ref 5–15)
BUN: 13 mg/dL (ref 6–20)
CO2: 26 mmol/L (ref 22–32)
Calcium: 9.2 mg/dL (ref 8.9–10.3)
Chloride: 103 mmol/L (ref 98–111)
Creatinine, Ser: 0.85 mg/dL (ref 0.44–1.00)
GFR calc Af Amer: >60 mL/min (ref >60)
GFR calc non Af Amer: >60 mL/min (ref >60)
Glucose, Bld: 113 mg/dL — ABNORMAL HIGH (ref 70–99)
Potassium: 3.9 mmol/L (ref 3.5–5.1)
Sodium: 138 mmol/L (ref 135–145)

## 2018-12-26 LAB — TYPE AND SCREEN
ABO/RH(D): O POS
Antibody Screen: NEGATIVE

## 2018-12-26 LAB — PROTIME-INR
INR: 1 (ref 0.8–1.2)
Prothrombin Time: 13.2 seconds (ref 11.4–15.2)

## 2018-12-26 MED ORDER — CELECOXIB 200 MG PO CAPS
200.0000 mg | ORAL_CAPSULE | Freq: Two times a day (BID) | ORAL | Status: DC
  Administered 2018-12-26 – 2018-12-29 (×6): 200 mg via ORAL
  Filled 2018-12-26 (×6): qty 1

## 2018-12-26 MED ORDER — HYDROMORPHONE HCL 1 MG/ML IJ SOLN
0.5000 mg | INTRAMUSCULAR | Status: DC | PRN
  Administered 2018-12-26: 1 mg via INTRAVENOUS
  Administered 2018-12-27: 0.5 mg via INTRAVENOUS
  Administered 2018-12-27 – 2018-12-29 (×7): 1 mg via INTRAVENOUS
  Filled 2018-12-26 (×9): qty 1

## 2018-12-26 MED ORDER — OXYCODONE HCL 5 MG PO TABS
10.0000 mg | ORAL_TABLET | ORAL | Status: DC | PRN
  Administered 2018-12-26 – 2018-12-29 (×7): 15 mg via ORAL
  Administered 2018-12-29: 10 mg via ORAL
  Filled 2018-12-26 (×7): qty 3

## 2018-12-26 MED ORDER — ONDANSETRON HCL 4 MG PO TABS
4.0000 mg | ORAL_TABLET | Freq: Four times a day (QID) | ORAL | Status: DC | PRN
  Administered 2018-12-27 – 2018-12-28 (×3): 4 mg via ORAL
  Filled 2018-12-26 (×3): qty 1

## 2018-12-26 MED ORDER — FENTANYL CITRATE (PF) 100 MCG/2ML IJ SOLN
100.0000 ug | Freq: Once | INTRAMUSCULAR | Status: AC
  Administered 2018-12-26: 100 ug via INTRAVENOUS
  Filled 2018-12-26: qty 2

## 2018-12-26 MED ORDER — OXYCODONE HCL 5 MG PO TABS
5.0000 mg | ORAL_TABLET | ORAL | Status: DC | PRN
  Administered 2018-12-28: 5 mg via ORAL
  Administered 2018-12-28 (×2): 10 mg via ORAL
  Filled 2018-12-26 (×2): qty 2
  Filled 2018-12-26: qty 1
  Filled 2018-12-26: qty 2

## 2018-12-26 MED ORDER — METHOCARBAMOL 500 MG PO TABS
500.0000 mg | ORAL_TABLET | Freq: Four times a day (QID) | ORAL | Status: DC | PRN
  Administered 2018-12-27 – 2018-12-29 (×7): 500 mg via ORAL
  Filled 2018-12-26 (×7): qty 1

## 2018-12-26 MED ORDER — CEFAZOLIN SODIUM-DEXTROSE 2-4 GM/100ML-% IV SOLN
2.0000 g | INTRAVENOUS | Status: AC
  Administered 2018-12-27: 08:00:00 2 g via INTRAVENOUS
  Filled 2018-12-26 (×2): qty 100

## 2018-12-26 MED ORDER — MORPHINE SULFATE (PF) 4 MG/ML IV SOLN
4.0000 mg | Freq: Once | INTRAVENOUS | Status: AC
  Administered 2018-12-26: 4 mg via INTRAVENOUS
  Filled 2018-12-26: qty 1

## 2018-12-26 MED ORDER — DIPHENHYDRAMINE HCL 12.5 MG/5ML PO ELIX
12.5000 mg | ORAL_SOLUTION | ORAL | Status: DC | PRN
  Administered 2018-12-27: 12.5 mg via ORAL
  Administered 2018-12-28: 23:00:00 25 mg via ORAL
  Filled 2018-12-26 (×2): qty 10

## 2018-12-26 MED ORDER — CHLORHEXIDINE GLUCONATE 4 % EX LIQD
60.0000 mL | Freq: Once | CUTANEOUS | Status: AC
  Administered 2018-12-26: 4 via TOPICAL
  Filled 2018-12-26: qty 60

## 2018-12-26 MED ORDER — FENTANYL CITRATE (PF) 100 MCG/2ML IJ SOLN
50.0000 ug | Freq: Once | INTRAMUSCULAR | Status: AC
  Administered 2018-12-26: 17:00:00 50 ug via INTRAVENOUS
  Filled 2018-12-26: qty 2

## 2018-12-26 MED ORDER — DOCUSATE SODIUM 100 MG PO CAPS
100.0000 mg | ORAL_CAPSULE | Freq: Two times a day (BID) | ORAL | Status: DC
  Administered 2018-12-26 – 2018-12-29 (×5): 100 mg via ORAL
  Filled 2018-12-26 (×5): qty 1

## 2018-12-26 MED ORDER — ONDANSETRON HCL 4 MG/2ML IJ SOLN
4.0000 mg | Freq: Four times a day (QID) | INTRAMUSCULAR | Status: DC | PRN
  Administered 2018-12-26: 4 mg via INTRAVENOUS
  Filled 2018-12-26: qty 2

## 2018-12-26 MED ORDER — LACTATED RINGERS IV SOLN
INTRAVENOUS | Status: DC
  Administered 2018-12-26: 19:00:00 via INTRAVENOUS

## 2018-12-26 MED ORDER — METHOCARBAMOL 1000 MG/10ML IJ SOLN
500.0000 mg | Freq: Four times a day (QID) | INTRAMUSCULAR | Status: DC | PRN
  Filled 2018-12-26: qty 5

## 2018-12-26 MED ORDER — ACETAMINOPHEN 500 MG PO TABS
1000.0000 mg | ORAL_TABLET | Freq: Three times a day (TID) | ORAL | Status: DC
  Administered 2018-12-26 – 2018-12-29 (×7): 1000 mg via ORAL
  Filled 2018-12-26 (×7): qty 2

## 2018-12-26 MED ORDER — ONDANSETRON HCL 4 MG/2ML IJ SOLN
4.0000 mg | Freq: Once | INTRAMUSCULAR | Status: AC
  Administered 2018-12-26: 4 mg via INTRAVENOUS
  Filled 2018-12-26: qty 2
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
## 2018-12-26 NOTE — ED Notes (Signed)
Please call pts sister Page Spiro [phone] to update on her status

## 2018-12-26 NOTE — Progress Notes (Signed)
Orthopedic Tech Progress Note Patient Details:  Brianna Rhodes [birth year] [id number]  Ortho Devices Type of Ortho Device: Post (long leg) splint, Stirrup splint Ortho Device/Splint Location: lle posterir long leg splint  with long leg stirrups. Ortho Device/Splint Interventions: Ordered, Application, Adjustment   Post Interventions Patient Tolerated: Well Instructions Provided: Care of device, Adjustment of device Tyler and I assisted dr in splint application.  Trinna Post 12/26/2018, 7:15 PM
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
## 2018-12-26 NOTE — Anesthesia Preprocedure Evaluation (Addendum)
Anesthesia Evaluation  Patient identified by MRN, date of birth, ID band Patient awake    Reviewed: Allergy & Precautions, NPO status , Patient's Chart, lab work & pertinent test results  History of Anesthesia Complications (+) PONV and history of anesthetic complications  Airway Mallampati: II  TM Distance: >3 FB Neck ROM: Full    Dental  (+) Dental Advisory Given, Teeth Intact   Pulmonary neg pulmonary ROS,    breath sounds clear to auscultation       Cardiovascular + Valvular Problems/Murmurs MVP  Rhythm:Regular Rate:Normal     Neuro/Psych PSYCHIATRIC DISORDERS  Meniere's disease     GI/Hepatic negative GI ROS, (+)     substance abuse  marijuana use,   Endo/Other  negative endocrine ROS  Renal/GU negative Renal ROS     Musculoskeletal negative musculoskeletal ROS (+)   Abdominal   Peds  (+) ATTENTION DEFICIT DISORDER WITHOUT HYPERACTIVITY Hematology negative hematology ROS (+)   Anesthesia Other Findings   Reproductive/Obstetrics                            Anesthesia Physical Anesthesia Plan  ASA: II and emergent  Anesthesia Plan: General   Post-op Pain Management:    Induction: Intravenous  PONV Risk Score and Plan: 4 or greater and Treatment may vary due to age or medical condition, Ondansetron, Dexamethasone and Midazolam  Airway Management Planned: Oral ETT  Additional Equipment: None  Intra-op Plan:   Post-operative Plan: Extubation in OR  Informed Consent: I have reviewed the patients History and Physical, chart, labs and discussed the procedure including the risks, benefits and alternatives for the proposed anesthesia with the patient or authorized representative who has indicated his/her understanding and acceptance.     Dental advisory given  Plan Discussed with: CRNA and Anesthesiologist  Anesthesia Plan Comments:       Anesthesia Quick  Evaluation

## 2018-12-27 ENCOUNTER — Observation Stay (HOSPITAL_COMMUNITY): Payer: BLUE CROSS/BLUE SHIELD

## 2018-12-27 ENCOUNTER — Other Ambulatory Visit: Payer: Self-pay

## 2018-12-27 ENCOUNTER — Observation Stay (HOSPITAL_COMMUNITY): Payer: BLUE CROSS/BLUE SHIELD | Admitting: Anesthesiology

## 2018-12-27 ENCOUNTER — Encounter (HOSPITAL_COMMUNITY)
Admission: EM | Disposition: A | Discharge status: Home / Self Care | Payer: Self-pay | Source: Home / Self Care | Attending: Orthopaedic Surgery

## 2018-12-27 ENCOUNTER — Encounter (HOSPITAL_COMMUNITY): Payer: Self-pay

## 2018-12-27 HISTORY — PX: TIBIA IM NAIL INSERTION: SHX2516

## 2018-12-27 LAB — SURGICAL PCR SCREEN
MRSA, PCR: NEGATIVE
Staphylococcus aureus: NEGATIVE

## 2018-12-27 LAB — HIV ANTIBODY (ROUTINE TESTING W REFLEX): HIV Screen 4th Generation wRfx: NONREACTIVE

## 2018-12-27 SURGERY — INSERTION, INTRAMEDULLARY ROD, TIBIA
Anesthesia: General | Site: Leg Lower | Laterality: Left

## 2018-12-27 MED ORDER — SCOPOLAMINE 1 MG/3DAYS TD PT72
MEDICATED_PATCH | TRANSDERMAL | Status: DC | PRN
  Administered 2018-12-27: 1 via TRANSDERMAL

## 2018-12-27 MED ORDER — FENTANYL CITRATE (PF) 250 MCG/5ML IJ SOLN
INTRAMUSCULAR | Status: DC | PRN
  Administered 2018-12-27: 100 ug via INTRAVENOUS
  Administered 2018-12-27 (×2): 50 ug via INTRAVENOUS

## 2018-12-27 MED ORDER — EPHEDRINE 5 MG/ML INJ
INTRAVENOUS | Status: AC
  Filled 2018-12-27: qty 10

## 2018-12-27 MED ORDER — CEFAZOLIN SODIUM-DEXTROSE 1-4 GM/50ML-% IV SOLN
1.0000 g | Freq: Three times a day (TID) | INTRAVENOUS | Status: AC
  Administered 2018-12-27 (×2): 1 g via INTRAVENOUS
  Filled 2018-12-27 (×2): qty 50

## 2018-12-27 MED ORDER — 0.9 % SODIUM CHLORIDE (POUR BTL) OPTIME
TOPICAL | Status: DC | PRN
  Administered 2018-12-27: 1000 mL

## 2018-12-27 MED ORDER — PHENYLEPHRINE 40 MCG/ML (10ML) SYRINGE FOR IV PUSH (FOR BLOOD PRESSURE SUPPORT)
PREFILLED_SYRINGE | INTRAVENOUS | Status: DC | PRN
  Administered 2018-12-27: 80 ug via INTRAVENOUS
  Administered 2018-12-27: 200 ug via INTRAVENOUS
  Administered 2018-12-27: 120 ug via INTRAVENOUS

## 2018-12-27 MED ORDER — DEXAMETHASONE SODIUM PHOSPHATE 10 MG/ML IJ SOLN
INTRAMUSCULAR | Status: AC
  Filled 2018-12-27: qty 1

## 2018-12-27 MED ORDER — FENTANYL CITRATE (PF) 100 MCG/2ML IJ SOLN
INTRAMUSCULAR | Status: AC
  Filled 2018-12-27: qty 2

## 2018-12-27 MED ORDER — SCOPOLAMINE 1 MG/3DAYS TD PT72
MEDICATED_PATCH | TRANSDERMAL | Status: AC
  Filled 2018-12-27: qty 1

## 2018-12-27 MED ORDER — ROCURONIUM BROMIDE 10 MG/ML (PF) SYRINGE
PREFILLED_SYRINGE | INTRAVENOUS | Status: DC | PRN
  Administered 2018-12-27: 40 mg via INTRAVENOUS
  Administered 2018-12-27: 10 mg via INTRAVENOUS

## 2018-12-27 MED ORDER — LIDOCAINE 2% (20 MG/ML) 5 ML SYRINGE
INTRAMUSCULAR | Status: AC
  Filled 2018-12-27: qty 5

## 2018-12-27 MED ORDER — SODIUM CHLORIDE 0.9 % IV SOLN
INTRAVENOUS | Status: DC | PRN
  Administered 2018-12-27: 35 ug/min via INTRAVENOUS

## 2018-12-27 MED ORDER — FENTANYL CITRATE (PF) 100 MCG/2ML IJ SOLN
INTRAMUSCULAR | Status: AC
  Administered 2018-12-27: 50 ug via INTRAVENOUS
  Filled 2018-12-27: qty 2

## 2018-12-27 MED ORDER — ONDANSETRON HCL 4 MG/2ML IJ SOLN
INTRAMUSCULAR | Status: AC
  Filled 2018-12-27: qty 2

## 2018-12-27 MED ORDER — VANCOMYCIN HCL 1000 MG IV SOLR
INTRAVENOUS | Status: AC
  Filled 2018-12-27: qty 1000

## 2018-12-27 MED ORDER — EPHEDRINE SULFATE 50 MG/ML IJ SOLN
INTRAMUSCULAR | Status: DC | PRN
  Administered 2018-12-27: 5 mg via INTRAVENOUS

## 2018-12-27 MED ORDER — LACTATED RINGERS IV SOLN
INTRAVENOUS | Status: DC
  Administered 2018-12-27 (×2): via INTRAVENOUS

## 2018-12-27 MED ORDER — FENTANYL CITRATE (PF) 250 MCG/5ML IJ SOLN
INTRAMUSCULAR | Status: AC
  Filled 2018-12-27: qty 5

## 2018-12-27 MED ORDER — ROCURONIUM BROMIDE 50 MG/5ML IV SOSY
PREFILLED_SYRINGE | INTRAVENOUS | Status: AC
  Filled 2018-12-27: qty 5

## 2018-12-27 MED ORDER — DEXAMETHASONE SODIUM PHOSPHATE 10 MG/ML IJ SOLN
INTRAMUSCULAR | Status: DC | PRN
  Administered 2018-12-27: 10 mg via INTRAVENOUS

## 2018-12-27 MED ORDER — PROPOFOL 10 MG/ML IV BOLUS: INTRAVENOUS | Status: DC | PRN

## 2018-12-27 MED ORDER — VANCOMYCIN HCL 1000 MG IV SOLR
INTRAVENOUS | Status: DC | PRN
  Administered 2018-12-27: 1000 mg via TOPICAL

## 2018-12-27 MED ORDER — SUCCINYLCHOLINE CHLORIDE 200 MG/10ML IV SOSY
PREFILLED_SYRINGE | INTRAVENOUS | Status: DC | PRN
  Administered 2018-12-27: 120 mg via INTRAVENOUS

## 2018-12-27 MED ORDER — FENTANYL CITRATE (PF) 100 MCG/2ML IJ SOLN
25.0000 ug | INTRAMUSCULAR | Status: AC | PRN
  Administered 2018-12-27 (×6): 50 ug via INTRAVENOUS

## 2018-12-27 MED ORDER — PROMETHAZINE HCL 25 MG/ML IJ SOLN: 6.2500 mg | INTRAMUSCULAR | Status: DC | PRN

## 2018-12-27 MED ORDER — MIDAZOLAM HCL 2 MG/2ML IJ SOLN
INTRAMUSCULAR | Status: DC | PRN
  Administered 2018-12-27: 2 mg via INTRAVENOUS

## 2018-12-27 MED ORDER — PROPOFOL 10 MG/ML IV BOLUS
INTRAVENOUS | Status: DC | PRN
  Administered 2018-12-27: 130 mg via INTRAVENOUS
  Administered 2018-12-27: 30 mg via INTRAVENOUS

## 2018-12-27 MED ORDER — BUPIVACAINE HCL (PF) 0.25 % IJ SOLN
INTRAMUSCULAR | Status: AC
  Filled 2018-12-27: qty 30

## 2018-12-27 MED ORDER — PHENYLEPHRINE 40 MCG/ML (10ML) SYRINGE FOR IV PUSH (FOR BLOOD PRESSURE SUPPORT)
PREFILLED_SYRINGE | INTRAVENOUS | Status: AC
  Filled 2018-12-27: qty 10

## 2018-12-27 MED ORDER — SUGAMMADEX SODIUM 200 MG/2ML IV SOLN
INTRAVENOUS | Status: DC | PRN
  Administered 2018-12-27: 180 mg via INTRAVENOUS

## 2018-12-27 MED ORDER — ONDANSETRON HCL 4 MG/2ML IJ SOLN
INTRAMUSCULAR | Status: DC | PRN
  Administered 2018-12-27: 4 mg via INTRAVENOUS

## 2018-12-27 MED ORDER — ENOXAPARIN SODIUM 40 MG/0.4ML ~~LOC~~ SOLN
40.0000 mg | SUBCUTANEOUS | Status: DC
  Administered 2018-12-28 – 2018-12-29 (×2): 40 mg via SUBCUTANEOUS
  Filled 2018-12-27 (×2): qty 0.4

## 2018-12-27 MED ORDER — LIDOCAINE 2% (20 MG/ML) 5 ML SYRINGE
INTRAMUSCULAR | Status: DC | PRN
  Administered 2018-12-27: 60 mg via INTRAVENOUS

## 2018-12-27 MED ORDER — MIDAZOLAM HCL 2 MG/2ML IJ SOLN
INTRAMUSCULAR | Status: AC
  Filled 2018-12-27: qty 2

## 2018-12-27 SURGICAL SUPPLY — 71 items
BANDAGE ACE 6X5 VEL STRL LF (GAUZE/BANDAGES/DRESSINGS) ×2 IMPLANT
BANDAGE ELASTIC 6 VELCRO ST LF (GAUZE/BANDAGES/DRESSINGS) ×1 IMPLANT
BIT DRILL 2.4 AO COUPLING CANN (BIT) ×1 IMPLANT
BIT DRILL CALIBRATED 4.3X320MM (BIT) IMPLANT
BIT DRILL CROWE POINT TWST 4.3 (DRILL) IMPLANT
BLADE SURG 10 STRL SS (BLADE) ×2 IMPLANT
BNDG COHESIVE 4X5 TAN STRL (GAUZE/BANDAGES/DRESSINGS) ×2 IMPLANT
BNDG GAUZE ELAST 4 BULKY (GAUZE/BANDAGES/DRESSINGS) ×2 IMPLANT
CANISTER SUCTION WELLS/JOHNSON (MISCELLANEOUS) ×2 IMPLANT
COVER MAYO STAND STRL (DRAPES) ×2 IMPLANT
COVER SURGICAL LIGHT HANDLE (MISCELLANEOUS) ×2 IMPLANT
COVER WAND RF STERILE (DRAPES) ×2 IMPLANT
DRAPE C-ARM 42X72 X-RAY (DRAPES) ×2 IMPLANT
DRAPE C-ARMOR (DRAPES) ×2 IMPLANT
DRAPE HALF SHEET 40X57 (DRAPES) ×2 IMPLANT
DRAPE IMP U-DRAPE 54X76 (DRAPES) ×2 IMPLANT
DRESSING AQUACEL AG SP 3.5X4 (GAUZE/BANDAGES/DRESSINGS) IMPLANT
DRESSING AQUACEL AG SP 3.5X6 (GAUZE/BANDAGES/DRESSINGS) IMPLANT
DRILL CALIBRATED 4.3X320MM (BIT) ×2
DRILL CROWE POINT TWIST 4.3 (DRILL) ×4
DRSG AQUACEL AG ADV 3.5X 6 (GAUZE/BANDAGES/DRESSINGS) ×2 IMPLANT
DRSG AQUACEL AG SP 3.5X4 (GAUZE/BANDAGES/DRESSINGS) ×6
DRSG AQUACEL AG SP 3.5X6 (GAUZE/BANDAGES/DRESSINGS) ×2
DURAPREP 26ML APPLICATOR (WOUND CARE) ×2 IMPLANT
ELECT CAUTERY BLADE 6.4 (BLADE) ×2 IMPLANT
ELECT REM PT RETURN 9FT ADLT (ELECTROSURGICAL) ×2
ELECTRODE REM PT RTRN 9FT ADLT (ELECTROSURGICAL) ×1 IMPLANT
GAUZE SPONGE 4X4 12PLY STRL (GAUZE/BANDAGES/DRESSINGS) ×2 IMPLANT
GAUZE XEROFORM 1X8 LF (GAUZE/BANDAGES/DRESSINGS) ×2 IMPLANT
GLOVE BIOGEL PI IND STRL 8 (GLOVE) ×1 IMPLANT
GLOVE BIOGEL PI INDICATOR 8 (GLOVE) ×1
GLOVE ECLIPSE 8.0 STRL XLNG CF (GLOVE) ×2 IMPLANT
GOWN STRL REUS W/ TWL LRG LVL3 (GOWN DISPOSABLE) ×2 IMPLANT
GOWN STRL REUS W/ TWL XL LVL3 (GOWN DISPOSABLE) ×2 IMPLANT
GOWN STRL REUS W/TWL LRG LVL3 (GOWN DISPOSABLE) ×4
GOWN STRL REUS W/TWL XL LVL3 (GOWN DISPOSABLE) ×4
GUIDEPIN 3.2X17.5 THRD DISP (PIN) ×1 IMPLANT
GUIDEWIRE 2.6X80 BEAD TIP (WIRE) IMPLANT
GUIDWIRE 2.6X80 BEAD TIP (WIRE) ×2
K-WIRE TROC 1.25X150 (WIRE) ×2
KIT BASIN OR (CUSTOM PROCEDURE TRAY) ×2 IMPLANT
KIT TURNOVER KIT B (KITS) ×2 IMPLANT
KWIRE TROC 1.25X150 (WIRE) IMPLANT
NAIL TIBIAL PHOENIX 9.0X310M (Nail) ×1 IMPLANT
NS IRRIG 1000ML POUR BTL (IV SOLUTION) ×2 IMPLANT
PACK ORTHO EXTREMITY (CUSTOM PROCEDURE TRAY) ×2 IMPLANT
PACK UNIVERSAL I (CUSTOM PROCEDURE TRAY) ×2 IMPLANT
PAD ARMBOARD 7.5X6 YLW CONV (MISCELLANEOUS) ×4 IMPLANT
PADDING CAST COTTON 6X4 STRL (CAST SUPPLIES) ×1 IMPLANT
PADDING CAST SYNTHETIC 4 (CAST SUPPLIES) ×1
PADDING CAST SYNTHETIC 4X4 STR (CAST SUPPLIES) IMPLANT
SCREW CANN 4.0X38MM (Screw) ×2 IMPLANT
SCREW CANN PT 4X38 NS (Screw) IMPLANT
SCREW CANN PT 4X40 NS (Screw) IMPLANT
SCREW CANNULATED PT 4.0X40 (Screw) ×2 IMPLANT
SCREW CORT TI DBL LEAD 5X34 (Screw) ×1 IMPLANT
SCREW CORT TI DBL LEAD 5X36 (Screw) ×1 IMPLANT
SCREW CORT TI DBL LEAD 5X42 (Screw) ×1 IMPLANT
SCREW CORT TI DBL LEAD 5X44 (Screw) ×1 IMPLANT
SPONGE LAP 18X18 RF (DISPOSABLE) IMPLANT
STAPLER VISISTAT 35W (STAPLE) ×1 IMPLANT
SUT ETHILON 3 0 PS 1 (SUTURE) ×2 IMPLANT
SUT MNCRL AB 4-0 PS2 18 (SUTURE) ×1 IMPLANT
SUT VIC AB 1 CT1 27 (SUTURE) ×2
SUT VIC AB 1 CT1 27XBRD ANBCTR (SUTURE) ×1 IMPLANT
SUT VIC AB 2-0 CT1 27 (SUTURE) ×2
SUT VIC AB 2-0 CT1 TAPERPNT 27 (SUTURE) ×1 IMPLANT
TAPE STRIPS DRAPE STRL (GAUZE/BANDAGES/DRESSINGS) ×1 IMPLANT
TUBE CONNECTING 12X1/4 (SUCTIONS) ×2 IMPLANT
WATER STERILE IRR 1000ML POUR (IV SOLUTION) ×2 IMPLANT
YANKAUER SUCT BULB TIP NO VENT (SUCTIONS) ×2 IMPLANT

## 2018-12-27 NOTE — Anesthesia Procedure Notes (Signed)
Procedure Name: Intubation Date/Time: 12/27/2018 7:54 AM Performed by: Bryson Corona, CRNA Pre-anesthesia Checklist: Patient identified, Emergency Drugs available, Suction available and Patient being monitored Patient Re-evaluated:Patient Re-evaluated prior to induction Oxygen Delivery Method: Circle System Utilized Preoxygenation: Pre-oxygenation with 100% oxygen Induction Type: IV induction Laryngoscope Size: Mac and 3 Grade View: Grade I Tube type: Oral Tube size: 7.0 mm Number of attempts: 1 Airway Equipment and Method: Stylet Placement Confirmation: ETT inserted through vocal cords under direct vision,  positive ETCO2 and breath sounds checked- equal and bilateral Secured at: 22 cm Tube secured with: Tape Dental Injury: Teeth and Oropharynx as per pre-operative assessment

## 2018-12-27 NOTE — Anesthesia Postprocedure Evaluation (Signed)
Anesthesia Post Note  Patient: Brianna Rhodes  Procedure(s) Performed: INTRAMEDULLARY (IM) NAIL TIBIAL (Left Leg Lower)     Patient location during evaluation: PACU Anesthesia Type: General Level of consciousness: awake and alert Pain management: pain level controlled Vital Signs Assessment: post-procedure vital signs reviewed and stable Respiratory status: spontaneous breathing, nonlabored ventilation and respiratory function stable Cardiovascular status: blood pressure returned to baseline and stable Postop Assessment: no apparent nausea or vomiting Anesthetic complications: no    Last Vitals:  Vitals:   12/27/18 1030 12/27/18 1235  BP:  103/73  Pulse:  97  Resp: 19 18  Temp:    SpO2:  96%                  Beryle Lathe

## 2018-12-27 NOTE — Transfer of Care (Signed)
Immediate Anesthesia Transfer of Care Note  Patient: Brianna Rhodes  Procedure(s) Performed: INTRAMEDULLARY (IM) NAIL TIBIAL (Left Leg Lower)  Patient Location: PACU  Anesthesia Type:General  Level of Consciousness: awake and alert   Airway & Oxygen Therapy: Patient Spontanous Breathing and Patient connected to face mask oxygen  Post-op Assessment: Report given to RN and Post -op Vital signs reviewed and stable  Post vital signs: Reviewed and stable  Last Vitals:  Vitals Value Taken Time  BP 131/57 12/27/2018 10:01 AM  Temp 36.5 C 12/27/2018  9:58 AM  Pulse 75 12/27/2018 10:05 AM  Resp 9 12/27/2018 10:05 AM  SpO2 100 % 12/27/2018 10:05 AM  Vitals shown include unvalidated device data.  Last Pain:  Vitals:   12/27/18 0415  TempSrc: Oral  PainSc:       Patients Stated Pain Goal: 3 (12/27/18 0221)  Complications: No apparent anesthesia complications

## 2018-12-27 NOTE — Progress Notes (Signed)
RN verified the presence of a signed informed consent that matches stated procedure by patient. Verified armband matches patient's stated name and birth date. Verified NPO status and that all jewelry, contact, glasses, dentures, and partials had been removed (if applicable). Call phone taken to PACU. Has ring on left ring finger that patient states she is unable to remove. Anesthesia notified.

## 2018-12-27 NOTE — Interval H&P Note (Signed)
Discussed case, risks and benefits with patient again.  All questions answered, no change to history.  Brunilda Eble MD  

## 2018-12-27 NOTE — Plan of Care (Signed)
  Problem: Education: Goal: Knowledge of General Education information will improve Description Including pain rating scale, medication(s)/side effects and non-pharmacologic comfort measures Outcome: Progressing   Problem: Clinical Measurements: Goal: Respiratory complications will improve Outcome: Not Applicable Goal: Cardiovascular complication will be avoided Outcome: Progressing   Problem: Coping: Goal: Level of anxiety will decrease Outcome: Progressing   Problem: Pain Managment: Goal: General experience of comfort will improve Outcome: Progressing   Problem: Safety: Goal: Ability to remain free from injury will improve Outcome: Progressing   

## 2018-12-27 NOTE — Progress Notes (Signed)
PT Cancellation Note  Patient Details Name: Brianna Rhodes MRN: [id number] DOB: Mar 18, 1961   Cancelled Treatment:    Reason Eval/Treat Not Completed: Patient not medically ready(remains in PACU at this time, will follow)   Fabio Asa 12/27/2018, 12:35 PM
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
## 2018-12-28 DIAGNOSIS — S82392A Other fracture of lower end of left tibia, initial encounter for closed fracture: Secondary | ICD-10-CM | POA: Diagnosis present

## 2018-12-28 DIAGNOSIS — Y9355 Activity, bike riding: Secondary | ICD-10-CM | POA: Diagnosis not present

## 2018-12-28 DIAGNOSIS — S82462A Displaced segmental fracture of shaft of left fibula, initial encounter for closed fracture: Secondary | ICD-10-CM | POA: Diagnosis present

## 2018-12-28 MED ORDER — HYDROMORPHONE HCL 1 MG/ML IJ SOLN
1.0000 mg | Freq: Once | INTRAMUSCULAR | Status: AC
  Administered 2018-12-28: 1 mg via INTRAVENOUS
  Filled 2018-12-28: qty 1

## 2018-12-28 MED ORDER — OXYCODONE HCL 5 MG PO TABS: ORAL_TABLET | ORAL | 0 refills | Status: AC

## 2018-12-28 MED ORDER — ENOXAPARIN SODIUM 40 MG/0.4ML ~~LOC~~ SOLN: 40.0000 mg | SUBCUTANEOUS | 1 refills | Status: DC

## 2018-12-28 MED ORDER — ONDANSETRON HCL 4 MG PO TABS: 4.0000 mg | ORAL_TABLET | Freq: Three times a day (TID) | ORAL | 1 refills | Status: AC | PRN

## 2018-12-28 MED ORDER — MELOXICAM 7.5 MG PO TABS: 7.5000 mg | ORAL_TABLET | Freq: Every day | ORAL | 2 refills | Status: DC

## 2018-12-28 MED ORDER — ACETAMINOPHEN 500 MG PO TABS: 1000.0000 mg | ORAL_TABLET | Freq: Three times a day (TID) | ORAL | 0 refills | Status: AC

## 2018-12-28 NOTE — Plan of Care (Signed)

## 2018-12-28 NOTE — Progress Notes (Signed)
Pt States is not ready to go home with this pain dr Everardo Pacific called regarding pain medication
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
## 2018-12-28 NOTE — Discharge Instructions (Signed)
°  Ramond Marrow MD, MPH [facility] [address], [address] [phone] (tel)   [phone] (fax)   POST-OPERATIVE INSTRUCTIONS   WOUND CARE ? Please keep splint clean dry and intact until followup.  ? You may shower on Post-Op Day #2. You must keep splint dry during this process and may find that a plastic bag taped around the leg or alternatively a towel based bath may be a better option.  If you get your splint wet or if it is damaged please contact our clinic.  EXERCISES ? Due to your splint being in place you will not be able to bear weight through your extremity.  Please use crutches or a walker to avoid weight bearing.   POST-OP MEDICINES ? A multi-modal approach will be used to treat your pain.  Oxycodone - This is a strong narcotic, to be used only on an as needed basis for pain.  Acetaminophen 500mg - A non-narcotic pain medicine.  Use 1000mg  three times a day for the first 14 days after surgery    Meloxicam - a non narctoic anti-inflammatory and pain reliever.  Do not take additional ibuprofen, naproxen or other NSAID while taking this medicine.   Zofran 4mg  - This is an anti-nausea medicine to be used only if you are having nausea or vomitting.  Lovenox 40mg  - This is a medicine used to help prevent blood clots.  Please use it daily.   ? If you have any adverse effects with the medications, please call our office.  FOLLOW-UP ? If you develop a Fever (>101.5), Redness or Drainage from the surgical incision site, please call our office to arrange for an evaluation. ? Please call the office to schedule a follow-up appointment for your suture removal, 10-14 days post-operatively.  IF YOU HAVE ANY QUESTIONS, PLEASE FEEL FREE TO CALL OUR OFFICE.  HELPFUL INFORMATION ? You should wean off your narcotic medicines as soon as you are able.  Most patients will be off or using minimal narcotics before their first postop appointment.    ? We suggest you  use the pain medication the first night prior to going to bed, in order to ease any pain when the anesthesia wears off. You should avoid taking pain medications on an empty stomach as it will make you nauseous.  ? Do not drink alcoholic beverages or take illicit drugs when taking pain medications.  ? In most states it is against the law to drive while you are in a splint or sling.  And certainly against the law to drive while taking narcotics.  ? You may return to work/school in the next couple of days when you feel up to it.   ? Pain medication may make you constipated.  Below are a few solutions to try in this order: - Decrease the amount of pain medication if you arent having pain. - Drink lots of decaffeinated fluids. - Drink prune juice and/or each dried prunes  o If the first 3 dont work start with additional solutions - Take Colace - an over-the-counter stool softener - Take Senokot - an over-the-counter laxative - Take Miralax - a stronger over-the-counter laxative

## 2018-12-28 NOTE — Progress Notes (Signed)
Dr Everardo Pacific called back pt can have a 1mg  of dilantin now and d/c tommorrow

## 2018-12-28 NOTE — Discharge Summary (Addendum)
Patient ID: Brianna Rhodes MRN: [id number] DOB/AGE: 57-Jan-1962 57 y.o.  Admit date: 12/26/2018 Discharge date: 12/29/2018  Admission Diagnoses:Left tibia fracture  Discharge Diagnoses:  Active Problems:   Fracture of tibial shaft, left, closed   Past Medical History:  Diagnosis Date  . ADD (attention deficit disorder)   . Family history of anesthesia complication    father has n/v  . Heart murmur    mild MVP  . Meniere's disease   . PONV (postoperative nausea and vomiting)   . Wears glasses      Procedures Performed: Left tibial IM nail  Discharged Condition: good  Hospital Course: Patient brought in from ER for urgent for surgery.  Tolerated procedure well.  Was kept for monitoring overnight for pain control and medical monitoring postop and was found to be stable for DC home the morning after surgery.  Patient was instructed on specific activity restrictions and all questions were answered.   Consults: None  Significant Diagnostic Studies: No additional pertinent studies  Treatments: Surgery  Discharge Exam:  splint CDI, +EHL though remainder of motor difficult to test due to splint, sensation intact distally with warm well perfused foot, no pain w passive stretch   Disposition:    Discharge Instructions    Call MD for:  persistant nausea and vomiting   Complete by:  As directed    Call MD for:  redness, tenderness, or signs of infection (pain, swelling, redness, odor or green/yellow discharge around incision site)   Complete by:  As directed    Call MD for:  severe uncontrolled pain   Complete by:  As directed    Diet - low sodium heart healthy   Complete by:  As directed    Discharge   Complete by:  As directed    Discharge to home today   Increase activity slowly   Complete by:  As directed      Allergies as of 12/29/2018      Reactions   Codeine Itching   Percodan [oxycodone-aspirin] Nausea And Vomiting      Medication List    STOP taking  these medications   naproxen sodium 550 MG tablet Commonly known as:  Anaprox DS     TAKE these medications   acetaminophen 500 MG tablet Commonly known as:  TYLENOL Take 2 tablets (1,000 mg total) by mouth every 8 (eight) hours for 14 days.   cephALEXin 500 MG capsule Commonly known as:  KEFLEX Take 1 capsule (500 mg total) by mouth 3 (three) times daily with meals.   Claritin 10 MG Caps Generic drug:  Loratadine Take 10 mg by mouth daily.   enoxaparin 40 MG/0.4ML injection Commonly known as:  LOVENOX Inject 0.4 mLs (40 mg total) into the skin daily.   gabapentin 300 MG capsule Commonly known as:  NEURONTIN Take 300 mg by mouth 3 (three) times daily. Reported on 02/23/2016   meloxicam 7.5 MG tablet Commonly known as:  Mobic Take 1 tablet (7.5 mg total) by mouth 2 (two) times daily.   ondansetron 4 MG tablet Commonly known as:  Zofran Take 1 tablet (4 mg total) by mouth every 8 (eight) hours as needed for up to 7 days for nausea or vomiting.   oxyCODONE 5 MG immediate release tablet Commonly known as:  Oxy IR/ROXICODONE Take 1-2 pills every 6 hrs as needed for pain, no more than 6 per day      Follow-up Information    Bjorn Pippin, MD. Schedule  an appointment as soon as possible for a visit on 01/01/2019.   Specialty:  Orthopedic Surgery Contact information: [address] [place] [address] [phone]

## 2018-12-29 ENCOUNTER — Encounter (HOSPITAL_COMMUNITY): Payer: Self-pay | Admitting: Orthopaedic Surgery

## 2018-12-29 MED ORDER — MELOXICAM 7.5 MG PO TABS: 7.5000 mg | ORAL_TABLET | Freq: Two times a day (BID) | ORAL | 2 refills | Status: AC

## 2018-12-29 NOTE — Progress Notes (Signed)
The patient was given discharge instructions.  Written discharge instruction packet provided.  The patient has a prescription for lovenox and verbalizes understanding how to administer.  Discharged to home via wheel chair.

## 2018-12-29 NOTE — Progress Notes (Addendum)
Physical Therapy Treatment Patient Details Name: Brianna Rhodes MRN: [id number] DOB: 1961-02-06 Today's Date: 12/29/2018    History of Present Illness Patient is a 58 yo female who presents s/p INTRAMEDULLARY (IM) NAIL TIBIAL (Left Leg Lower)    PT Comments    Patient seen for mobility progression. Pt is making progress toward PT goals. This session focused on gait and stair training. Overall pt requires min guard for ambulation and functional transfers and min A for stair negotiation simulating home entrance. Pt is more steady and reports feeling more comfortable with use of RW at this time however when pt is able to tolerate TDWB vs NWB will likely be able to transition to crutches.  Continue to progress as tolerated.    Follow Up Recommendations  Supervision for mobility/OOB     Equipment Recommendations  Rolling walker with 5" wheels    Recommendations for Other Services       Precautions / Restrictions Precautions Precautions: Fall Restrictions Weight Bearing Restrictions: Yes LLE Weight Bearing: Touchdown weight bearing    Mobility  Bed Mobility Overal bed mobility: Modified Independent Bed Mobility: Supine to Sit              Transfers Overall transfer level: Needs assistance Equipment used: Rolling walker (2 wheeled) Transfers: Sit to/from Stand Sit to Stand: Min guard         General transfer comment: cues for safe hand placement; min guard for safety  Ambulation/Gait Ambulation/Gait assistance: Min guard Gait Distance (Feet): (~50 ft with RW and ~40 ft with crutches) Assistive device: Rolling walker (2 wheeled);Crutches Gait Pattern/deviations: Step-to pattern Gait velocity: decreased   General Gait Details: pt maintains NWB L LE due to pain; limited gait distance due to pain; cues for sequencing and safe use of AD especially crutches; pt is more steady with use of RW especially with stepping backwards/turning   Stairs Stairs: Yes Stairs  assistance: Min assist Stair Management: No rails;Step to pattern;Backwards;With walker Number of Stairs: 1 General stair comments: cues for sequencing and technique; assist to stabilize RW    Wheelchair Mobility    Modified Rankin (Stroke Patients Only)       Balance Overall balance assessment: Needs assistance   Sitting balance-Leahy Scale: Good     Standing balance support: During functional activity Standing balance-Leahy Scale: Fair                              Cognition Arousal/Alertness: Awake/alert Behavior During Therapy: WFL for tasks assessed/performed Overall Cognitive Status: Within Functional Limits for tasks assessed                                        Exercises      General Comments        Pertinent Vitals/Pain Pain Assessment: Faces Faces Pain Scale: Hurts little more Pain Location: left LE in dependent position Pain Descriptors / Indicators: Discomfort;Grimacing;Guarding;Throbbing Pain Intervention(s): Limited activity within patient's tolerance;Monitored during session;Repositioned;Premedicated before session    Home Living                      Prior Function            PT Goals (current goals can now be found in the care plan section) Acute Rehab PT Goals Patient Stated Goal: to go home Progress towards PT  goals: Progressing toward goals    Frequency    Min 5X/week      PT Plan Current plan remains appropriate    Co-evaluation              AM-PAC PT "6 Clicks" Mobility   Outcome Measure  Help needed turning from your back to your side while in a flat bed without using bedrails?: None Help needed moving from lying on your back to sitting on the side of a flat bed without using bedrails?: None Help needed moving to and from a bed to a chair (including a wheelchair)?: A Little Help needed standing up from a chair using your arms (e.g., wheelchair or bedside chair)?: A Little Help  needed to walk in hospital room?: A Little Help needed climbing 3-5 steps with a railing? : A Little 6 Click Score: 20    End of Session Equipment Utilized During Treatment: Gait belt Activity Tolerance: Patient tolerated treatment well Patient left: in chair;with call bell/phone within reach Nurse Communication: Mobility status PT Visit Diagnosis: Difficulty in walking, not elsewhere classified (R26.2);Pain Pain - Right/Left: Left Pain - part of body: Leg     Time: 4403-4742 PT Time Calculation (min) (ACUTE ONLY): 25 min  Charges:  $Gait Training: 23-37 mins                     Erline Levine, PTA Acute Rehabilitation Services Pager: [phone] Office: [phone]     Carolynne Edouard 12/29/2018, 10:31 AM

## 2018-12-29 NOTE — Progress Notes (Signed)
Dr Austin Miles office notified of the need for discharge order so that discharge AVS can be printed

## 2019-05-04 ENCOUNTER — Other Ambulatory Visit: Payer: Self-pay

## 2019-05-04 DIAGNOSIS — Z20822 Contact with and (suspected) exposure to covid-19: Secondary | ICD-10-CM

## 2019-05-06 LAB — NOVEL CORONAVIRUS, NAA: SARS-CoV-2, NAA: NOT DETECTED

## 2019-07-06 ENCOUNTER — Other Ambulatory Visit: Payer: Self-pay

## 2019-07-06 DIAGNOSIS — Z20822 Contact with and (suspected) exposure to covid-19: Secondary | ICD-10-CM

## 2019-07-07 LAB — NOVEL CORONAVIRUS, NAA: SARS-CoV-2, NAA: NOT DETECTED

## 2019-08-19 ENCOUNTER — Ambulatory Visit
Admission: RE | Admit: 2019-08-19 | Discharge: 2019-08-19 | Disposition: A | Discharge status: Home / Self Care | Payer: Medicaid Other | Source: Ambulatory Visit | Attending: Family Medicine | Admitting: Family Medicine

## 2019-08-19 ENCOUNTER — Other Ambulatory Visit: Payer: Self-pay | Admitting: Family Medicine

## 2019-08-19 DIAGNOSIS — R05 Cough: Secondary | ICD-10-CM

## 2019-08-19 DIAGNOSIS — R059 Cough, unspecified: Secondary | ICD-10-CM

## 2020-04-13 ENCOUNTER — Encounter (HOSPITAL_COMMUNITY): Payer: Self-pay

## 2020-04-13 ENCOUNTER — Emergency Department (HOSPITAL_COMMUNITY)
Admission: EM | Admit: 2020-04-13 | Discharge: 2020-04-13 | Disposition: A | Discharge status: Home / Self Care | Payer: Medicaid Other | Attending: Emergency Medicine | Admitting: Emergency Medicine

## 2020-04-13 ENCOUNTER — Emergency Department (HOSPITAL_COMMUNITY): Payer: Medicaid Other

## 2020-04-13 DIAGNOSIS — S6991XA Unspecified injury of right wrist, hand and finger(s), initial encounter: Secondary | ICD-10-CM | POA: Diagnosis present

## 2020-04-13 DIAGNOSIS — Y929 Unspecified place or not applicable: Secondary | ICD-10-CM | POA: Diagnosis not present

## 2020-04-13 DIAGNOSIS — W1839XA Other fall on same level, initial encounter: Secondary | ICD-10-CM | POA: Diagnosis not present

## 2020-04-13 DIAGNOSIS — S52501A Unspecified fracture of the lower end of right radius, initial encounter for closed fracture: Secondary | ICD-10-CM

## 2020-04-13 DIAGNOSIS — Y999 Unspecified external cause status: Secondary | ICD-10-CM | POA: Insufficient documentation

## 2020-04-13 DIAGNOSIS — S52591A Other fractures of lower end of right radius, initial encounter for closed fracture: Secondary | ICD-10-CM | POA: Diagnosis not present

## 2020-04-13 DIAGNOSIS — Y939 Activity, unspecified: Secondary | ICD-10-CM | POA: Insufficient documentation

## 2020-04-13 MED ORDER — HYDROCODONE-ACETAMINOPHEN 5-325 MG PO TABS: 1.0000 | ORAL_TABLET | Freq: Four times a day (QID) | ORAL | 0 refills | Status: DC | PRN

## 2020-04-13 MED ORDER — PROMETHAZINE HCL 25 MG/ML IJ SOLN
12.5000 mg | Freq: Once | INTRAMUSCULAR | Status: AC
  Administered 2020-04-13: 12.5 mg via INTRAVENOUS
  Filled 2020-04-13: qty 1

## 2020-04-13 MED ORDER — PROMETHAZINE HCL 25 MG PO TABS: 25.0000 mg | ORAL_TABLET | Freq: Four times a day (QID) | ORAL | 0 refills | Status: DC | PRN

## 2020-04-13 MED ORDER — TRAMADOL HCL 50 MG PO TABS: 50.0000 mg | ORAL_TABLET | Freq: Four times a day (QID) | ORAL | 0 refills | Status: DC | PRN

## 2020-04-13 NOTE — ED Notes (Signed)
Ortho tech at bedside to place splint. +PMS. Skin integrity maint.

## 2020-04-13 NOTE — Discharge Instructions (Addendum)
Wear your wrist splint as applied until followed up by orthopedics.  Ice for 20 minutes every 2 hours while awake for the next 2 days.  Take tramadol as prescribed as needed for pain, and Phenergan as prescribed as needed for nausea.  Follow-up in the hand surgery clinic with Dr. Arita Miss in the next 1 to 2 days.  His contact information has been included in this discharge summary for you to call and make these arrangements.

## 2020-04-13 NOTE — ED Provider Notes (Signed)
[facility]-EMERGENCY DEPT Provider Note   CSN: [id number] Arrival date & time: 04/13/20  0762     History Chief Complaint  Patient presents with  . Fall    Brianna Rhodes is a 59 y.o. female.  Patient is a 59 year old female with past medical history of Mnire's disease presenting with complaints of fall.  Patient was walking the dogs this morning when she heard a sound behind her and turned her head to see what it was.  She then became dizzy and fell on outstretched hand.  She has pain in the right wrist.  She denies other injury.  The history is provided by the patient.  Fall This is a new problem. The current episode started less than 1 hour ago. The problem occurs constantly. The problem has not changed since onset.Exacerbated by: Movement and palpation. Nothing relieves the symptoms. She has tried nothing for the symptoms.       Past Medical History:  Diagnosis Date  . ADD (attention deficit disorder)   . Family history of anesthesia complication    father has n/v  . Heart murmur    mild MVP  . Meniere's disease   . PONV (postoperative nausea and vomiting)   . Wears glasses     Patient Active Problem List   Diagnosis Date Noted  . Fracture of tibial shaft, left, closed 12/26/2018    Past Surgical History:  Procedure Laterality Date  . DILATION AND CURETTAGE OF UTERUS  2005  . ENDOMETRIAL ABLATION  2005  . INGUINAL HERNIA REPAIR  2008   left  . OPEN REDUCTION INTERNAL FIXATION (ORIF) DISTAL RADIAL FRACTURE Left 09/25/2013   Procedure: OPEN REDUCTION INTERNAL FIXATION (ORIF) LEFT DISTAL RADIUS FRACTURE;  Surgeon: Tami Ribas, MD;  Location: [facility];  Service: Orthopedics;  Laterality: Left;  . TIBIA IM NAIL INSERTION Left 12/27/2018   Procedure: INTRAMEDULLARY (IM) NAIL TIBIAL;  Surgeon: Bjorn Pippin, MD;  Location: [facility] OR;  Service: Orthopedics;  Laterality: Left;     OB History   No obstetric history on file.      Family History  Problem Relation Age of Onset  . Heart Problems Mother   . Meniere's disease Mother   . Heart Problems Father     Social History   Tobacco Use  . Smoking status: Never Smoker  . Smokeless tobacco: Never Used  Substance Use Topics  . Alcohol use: No    Comment: quit in 1988  . Drug use: Yes    Types: Marijuana    Comment: not in 3 months-9/14    Home Medications Prior to Admission medications   Medication Sig Start Date End Date Taking? Authorizing Provider  cephALEXin (KEFLEX) 500 MG capsule Take 1 capsule (500 mg total) by mouth 3 (three) times daily with meals. Patient not taking: Reported on 12/27/2018 02/23/16   Wallis Bamberg, PA-C  enoxaparin (LOVENOX) 40 MG/0.4ML injection Inject 0.4 mLs (40 mg total) into the skin daily. 12/28/18 12/28/19  Bjorn Pippin, MD  gabapentin (NEURONTIN) 300 MG capsule Take 300 mg by mouth 3 (three) times daily. Reported on 02/23/2016    [provider]  Loratadine (CLARITIN) 10 MG CAPS Take 10 mg by mouth daily.    [provider]    Allergies    Codeine and Percodan [oxycodone-aspirin]  Review of Systems   Review of Systems  All other systems reviewed and are negative.   Physical Exam Updated Vital Signs BP (!) 144/91 (  BP Location: Left Arm)   Pulse (!) 54   Temp (!) 97.5 F (36.4 C) (Oral)   Resp 16   SpO2 95%   Physical Exam Vitals and nursing note reviewed.  Constitutional:      General: She is not in acute distress.    Appearance: Normal appearance. She is not ill-appearing.  HENT:     Head: Normocephalic and atraumatic.  Pulmonary:     Effort: Pulmonary effort is normal.  Musculoskeletal:     Comments: There is swelling and tenderness to the distal radius.  Patient is able to flex and extend her fingers and sensation and capillary refill are intact.  Skin:    General: Skin is warm and dry.  Neurological:     Mental Status: She is alert.     ED Results / Procedures / Treatments    Labs (all labs ordered are listed, but only abnormal results are displayed) Labs Reviewed - No data to display  EKG None  Radiology No results found.  Procedures Procedures (including critical care time)  Medications Ordered in ED Medications - No data to display  ED Course  I have reviewed the triage vital signs and the nursing notes.  Pertinent labs & imaging results that were available during my care of the patient were reviewed by me and considered in my medical decision making (see chart for details).    MDM Rules/Calculators/A&P  Patient with history of Mnire's disease presenting with complaints of right wrist pain after becoming dizzy and falling while walking her dog.  Her x-rays show a dorsally impacted distal radius fracture.  This was discussed with Earney Hamburg from hand surgery.  He is recommending a short arm splint and follow-up with the hand surgeon.  Patient will be advised to ice, call to arrange a follow-up appointment in the next 2 days.  She will also be prescribed medication for pain.  Final Clinical Impression(s) / ED Diagnoses Final diagnoses:  None    Rx / DC Orders ED Discharge Orders    None       Geoffery Lyons, MD 04/13/20 [phone]

## 2020-04-13 NOTE — ED Triage Notes (Signed)
59 yo female s/p fall after [place] in the park this [place]. Fell onto tar. Denies head injury. No neck/back pain. Denies blood thinners. Hx of Meniere's disease. C/O right F/A injury with obvious deformity per EMS  #18 left A/C placed en route. Received Fentanyl and Zorfran 8mg  by EMS.

## 2020-04-20 ENCOUNTER — Encounter: Payer: Self-pay | Admitting: Plastic Surgery

## 2020-04-20 ENCOUNTER — Ambulatory Visit (INDEPENDENT_AMBULATORY_CARE_PROVIDER_SITE_OTHER): Payer: Medicaid Other | Admitting: Plastic Surgery

## 2020-04-20 ENCOUNTER — Other Ambulatory Visit: Payer: Self-pay

## 2020-04-20 VITALS — BP 146/82 | HR 84 | Temp 98.2°F | Ht 66.0 in | Wt 146.2 lb

## 2020-04-20 DIAGNOSIS — S52501A Unspecified fracture of the lower end of right radius, initial encounter for closed fracture: Secondary | ICD-10-CM | POA: Diagnosis not present

## 2020-04-20 NOTE — Progress Notes (Signed)
Referring Provider Darrow Bussing, MD [address] [place],  [place] [address]   CC:  Chief Complaint  Patient presents with  . Advice Only      Brianna Rhodes is an 59 y.o. female.  HPI: Patient presents as referral from the emergency room for management of closed right distal radius fracture.  This looks to be an extra-articular dorsally angulated fracture.  It occurred 1 week ago.  She was splinted in a sugar tong in the emergency room and is followed up here.  We had initially planned to fix this operatively but she wanted to avoid surgery at all cost.  She has normal sensation in her fingers and reports that her pain is improving as time goes by.  She has stayed in her splint.  Allergies  Allergen Reactions  . Codeine Itching  . Percodan [Oxycodone-Aspirin] Nausea And Vomiting    Outpatient Encounter Medications as of 04/20/2020  Medication Sig  . cephALEXin (KEFLEX) 500 MG capsule Take 1 capsule (500 mg total) by mouth 3 (three) times daily with meals. (Patient not taking: Reported on 12/27/2018)  . enoxaparin (LOVENOX) 40 MG/0.4ML injection Inject 0.4 mLs (40 mg total) into the skin daily.  Marland Kitchen gabapentin (NEURONTIN) 300 MG capsule Take 300 mg by mouth 3 (three) times daily. Reported on 02/23/2016 (Patient not taking: Reported on 04/20/2020)  . Loratadine (CLARITIN) 10 MG CAPS Take 10 mg by mouth daily. (Patient not taking: Reported on 04/20/2020)  . promethazine (PHENERGAN) 25 MG tablet Take 1 tablet (25 mg total) by mouth every 6 (six) hours as needed for nausea. (Patient not taking: Reported on 04/20/2020)  . traMADol (ULTRAM) 50 MG tablet Take 1 tablet (50 mg total) by mouth every 6 (six) hours as needed. (Patient not taking: Reported on 04/20/2020)   No facility-administered encounter medications on file as of 04/20/2020.     Past Medical History:  Diagnosis Date  . ADD (attention deficit disorder)   . Family history of anesthesia complication    father has  n/v  . Heart murmur    mild MVP  . Meniere's disease   . PONV (postoperative nausea and vomiting)   . Wears glasses     Past Surgical History:  Procedure Laterality Date  . DILATION AND CURETTAGE OF UTERUS  2005  . ENDOMETRIAL ABLATION  2005  . INGUINAL HERNIA REPAIR  2008   left  . OPEN REDUCTION INTERNAL FIXATION (ORIF) DISTAL RADIAL FRACTURE Left 09/25/2013   Procedure: OPEN REDUCTION INTERNAL FIXATION (ORIF) LEFT DISTAL RADIUS FRACTURE;  Surgeon: Tami Ribas, MD;  Location: [facility];  Service: Orthopedics;  Laterality: Left;  . TIBIA IM NAIL INSERTION Left 12/27/2018   Procedure: INTRAMEDULLARY (IM) NAIL TIBIAL;  Surgeon: Bjorn Pippin, MD;  Location: [facility];  Service: Orthopedics;  Laterality: Left;    Family History  Problem Relation Age of Onset  . Heart Problems Mother   . Meniere's disease Mother   . Heart Problems Father     Social History   Social History Narrative   Patient lives at home with her children.   Caffeine Use: 2-3 cups daily     Review of Systems General: Denies fevers, chills, weight loss CV: Denies chest pain, shortness of breath, palpitations  Physical Exam Vitals with BMI 04/20/2020 04/13/2020 04/13/2020  Height 5\' 6"  - 5\' 6"   Weight 146 lbs 3 oz - 140 lbs  BMI 23.61 - 22.61  Systolic 146 120 -  Diastolic  82 75 -  Pulse 84 53 -    General:  No acute distress,  Alert and oriented, Non-Toxic, Normal speech and affect Right hand: She is in a sugar tong splint.  She has normal sensation in her fingers.  She has normal flexion extension of her fingers and thumb.  X-ray was reviewed.  It showed an extra-articular distal radius fracture with dorsal angulation and comminution.  Assessment/Plan I long discussion with the patient about her options.  I discussed the fact that I would recommend surgery with a volar plate fixation to improve the alignment of this fracture.  She wanted to avoid surgery if at all possible.  I explained the  nonoperative route would be around 8 weeks of immobilization.  I explained that the alignment of her distal radius is not anatomic and that could lead to predisposition for arthritis and pain down the line.  She is understanding of that and has a number of issues going on now with her father and other family members that she does not feel like she could have surgery.  She is fully understanding of the potential downsides of this.  I explained that after a few weeks surgery would not be an option from my standpoint and that the bone would heal in its current position.  I did ask her to get an x-ray today to confirm that the alignment has not worsened.  I will plan to send her to therapy next week for a change in her splint and I will plan to see her the following week with an additional x-ray.  All of her questions were answered.  Brianna Rhodes 04/20/2020, 2:01 PM

## 2020-05-03 ENCOUNTER — Ambulatory Visit (HOSPITAL_COMMUNITY)
Admission: RE | Admit: 2020-05-03 | Discharge: 2020-05-03 | Disposition: A | Discharge status: Home / Self Care | Payer: Medicaid Other | Source: Ambulatory Visit | Attending: Plastic Surgery | Admitting: Plastic Surgery

## 2020-05-03 ENCOUNTER — Other Ambulatory Visit: Payer: Self-pay

## 2020-05-03 DIAGNOSIS — S52501A Unspecified fracture of the lower end of right radius, initial encounter for closed fracture: Secondary | ICD-10-CM | POA: Insufficient documentation

## 2020-05-05 ENCOUNTER — Other Ambulatory Visit: Payer: Self-pay

## 2020-05-05 ENCOUNTER — Ambulatory Visit (INDEPENDENT_AMBULATORY_CARE_PROVIDER_SITE_OTHER): Payer: Medicaid Other | Admitting: Plastic Surgery

## 2020-05-05 ENCOUNTER — Encounter: Payer: Self-pay | Admitting: Plastic Surgery

## 2020-05-05 VITALS — BP 134/70 | HR 73 | Temp 97.8°F

## 2020-05-05 DIAGNOSIS — S52501A Unspecified fracture of the lower end of right radius, initial encounter for closed fracture: Secondary | ICD-10-CM | POA: Diagnosis not present

## 2020-05-05 NOTE — Progress Notes (Signed)
   Referring Provider Darrow Bussing, MD [address] [place],  [place] [address]   CC:  Chief Complaint  Patient presents with  . Follow-up      Brianna Rhodes is an 59 y.o. female.  HPI: Patient presents now 3 weeks out from her injury to her right wrist.  She sustained a dorsally displaced distal radius fracture.  We initially discussed surgery however she did not want to proceed down this path for multiple reasons.  She has had a follow-up x-ray and is coming back today to discuss the results.  She reports some stiffness in her fingers from the splint but feels like everything else is about the same.  Review of Systems General: Denies fevers or chills  Physical Exam Vitals with BMI 05/05/2020 04/20/2020 04/13/2020  Height - 5\' 6"  -  Weight - 146 lbs 3 oz -  BMI - 23.61 -  Systolic 134 146  Diastolic 70 82 75  Pulse 73 84 53    General:  No acute distress,  Alert and oriented, Non-Toxic, Normal speech and affect Right hand is well-perfused.  Fingers have good flexion and extension as does the thumb.  She has normal sensation.  X-ray shows basically unchanged alignment from the initial films which is not surprising.  There is still dorsal displacement and angulation of the distal fracture segment and the carpus.  Assessment/Plan Patient presents now 3 weeks out from distal radius fracture.  She elected nonoperative treatment.  We again discussed the options of operative and nonoperative intervention but at this point I do not think it is a good idea to attempt to intervene surgically.  She is in agreement with this plan.  We will plan to proceed with nonoperative management and have her splinted for another 3 to 5 weeks.  I Minna send her for a customized volar wrist splint with hand therapy.  I will plan to see her back in about a month.  All her questions were answered.  She is fully understanding about her x-ray which I showed her and the ramifications of the  displacement.  914 05/05/2020, 2:31 PM

## 2020-05-17 ENCOUNTER — Encounter: Payer: Self-pay | Admitting: *Deleted

## 2020-05-17 ENCOUNTER — Telehealth: Payer: Self-pay

## 2020-05-17 ENCOUNTER — Other Ambulatory Visit: Payer: Self-pay

## 2020-05-17 ENCOUNTER — Ambulatory Visit: Payer: Medicaid Other | Attending: Plastic Surgery | Admitting: *Deleted

## 2020-05-17 DIAGNOSIS — M25531 Pain in right wrist: Secondary | ICD-10-CM

## 2020-05-17 DIAGNOSIS — M6281 Muscle weakness (generalized): Secondary | ICD-10-CM

## 2020-05-17 DIAGNOSIS — R601 Generalized edema: Secondary | ICD-10-CM

## 2020-05-17 DIAGNOSIS — R208 Other disturbances of skin sensation: Secondary | ICD-10-CM

## 2020-05-17 DIAGNOSIS — M25631 Stiffness of right wrist, not elsewhere classified: Secondary | ICD-10-CM | POA: Diagnosis present

## 2020-05-17 DIAGNOSIS — R209 Unspecified disturbances of skin sensation: Secondary | ICD-10-CM | POA: Diagnosis present

## 2020-05-17 DIAGNOSIS — R278 Other lack of coordination: Secondary | ICD-10-CM | POA: Diagnosis not present

## 2020-05-17 NOTE — Patient Instructions (Signed)
WEARING SCHEDULE:  Wear splint at ALL times except for hygiene care. Do your exercises for your fingers with your splint on. Do not remove your splint or use your hand for any activity at this time while your fracture is healing.  PURPOSE:  To prevent movement and for protection until injury can heal  CARE OF SPLINT:  Keep splint away from heat sources including: stove, radiator or furnace, or a car in sunlight. The splint can melt and will no longer fit you properly  Keep away from pets and children  Clean the splint with rubbing alcohol or cool water and soap as needed. Keep arm and hand dry at all times. Do not get wet..  * During this time, make sure you also clean your hand/arm as instructed by your therapist and/or perform dressing changes as needed. Then dry hand/arm completely before replacing splint. (When cleaning hand/arm, keep it immobilized in same position until splint is replaced)  PRECAUTIONS/POTENTIAL PROBLEMS: *If you notice or experience increased pain, swelling, numbness, or a lingering reddened area from the splint: Contact your therapist immediately by calling [phone]. You must wear the splint for protection, but we will get you scheduled for adjustments as quickly as possible.  (If only straps or hooks need to be replaced and NO adjustments to the splint need to be made, just call the office ahead and let them know you are coming in)  If you have any medical concerns or signs of infection, please call your doctor immediately   Perform each exercise 4-6 times a day, with your splint on. And hold each position for 3-5 seconds and repeat 10 times (10 reps). Flexor Tendon Gliding (Active Full Fist)    Straighten all fingers, then make a fist, bending all joints. Repeat ____ times. Do ____ sessions per day.  Opposition (Active)    Touch tip of thumb to nail tip of each finger in turn, making an "O" shape. Repeat ____ times. Do ____ sessions per  day.  Copyright  VHI. All rights reserved.  Flexor Tendon Gliding (Active Straight Fist)    Start with fingers straight. Bend knuckles and middle joints. Keep fingertip joints straight to touch base of palm. Repeat ____ times. Do ____ sessions per day.  Copyright  VHI. All rights reserved.  Flexor Tendon Gliding (Active Hook Fist)    With fingers and knuckles straight, bend middle and tip joints. Do not bend large knuckles. Repeat ____ times. Do ____ sessions per day.  Copyright  VHI. All rights reserved.

## 2020-05-17 NOTE — Telephone Encounter (Signed)
Patient called requesting work restrictions since she is still wearing her splint/cast. She is starting work at [employer] and her orientation is Sunday, 05/22/2020.

## 2020-05-17 NOTE — Therapy (Signed)
[facility] [address] [place], [place], [address] Phone: [phone]   Fax:  [phone]  Occupational Therapy Evaluation  Patient Details  Name: Brianna Rhodes MRN: [id number] Date of Birth: 1960-12-25 Referring Provider (OT): Dr Arita Miss   Encounter Date: 05/17/2020   OT End of Session - 05/17/20 1041    Visit Number 1    Number of Visits 16   Pt has [place] [employer] & will need approval   Date for OT Re-Evaluation 08/02/20    Authorization Type Pt has [place] Medicaid [place], will need approval for visits.    Authorization - Visit Number 1    OT Start Time 0848    OT Stop Time 1015    OT Time Calculation (min) 87 min    Activity Tolerance Patient tolerated treatment well    Behavior During Therapy WFL for tasks assessed/performed;Anxious           Past Medical History:  Diagnosis Date  . ADD (attention deficit disorder)   . Family history of anesthesia complication    father has n/v  . Heart murmur    mild MVP  . Meniere's disease   . PONV (postoperative nausea and vomiting)   . Wears glasses     Past Surgical History:  Procedure Laterality Date  . DILATION AND CURETTAGE OF UTERUS  2005  . ENDOMETRIAL ABLATION  2005  . INGUINAL HERNIA REPAIR  2008   left  . OPEN REDUCTION INTERNAL FIXATION (ORIF) DISTAL RADIAL FRACTURE Left 09/25/2013   Procedure: OPEN REDUCTION INTERNAL FIXATION (ORIF) LEFT DISTAL RADIUS FRACTURE;  Surgeon: Tami Ribas, MD;  Location: [facility];  Service: Orthopedics;  Laterality: Left;  . TIBIA IM NAIL INSERTION Left 12/27/2018   Procedure: INTRAMEDULLARY (IM) NAIL TIBIAL;  Surgeon: Bjorn Pippin, MD;  Location: [facility] OR;  Service: Orthopedics;  Laterality: Left;    There were no vitals filed for this visit.   Subjective Assessment - 05/17/20 0854    Subjective  Pt reports that she sustained a right dorsal displaced distal radius fracture, non-operative  (04/13/2020). She has been f/u with Dr Arita Miss and was last seen 05/05/2020. She presents today for custom volar wrist splinting. Pt is R HD and is currently 4 weeks nad 6 days s/p injury while walking her dogs.    Pertinent History R closed tibial fracture 12/2019; Menieres disease, ADD, Heart Murmur    Patient Stated Goals Go to work at [employer] - pt has new job starting "Sunday" Pt was educated verbally that she needs to be cleared for return to work.    Currently in Pain? Yes    Pain Score 1     Pain Location Wrist    Pain Orientation Right    Pain Descriptors / Indicators Aching;Sore;Discomfort    Pain Type Acute pain    Pain Onset More than a month ago    Pain Frequency Intermittent    Aggravating Factors  When I pick something up (Pt was advised to not use her hand/wrist at this time secondary to need for fracture healing).    Pain Relieving Factors Tylenol; repositioning or stopping activity    Multiple Pain Sites No             [facility] OT Assessment - 05/17/20 0001      Assessment   Medical Diagnosis R dorsally displaced distal radius fracture    Referring Provider (OT) Dr Arita Miss    Onset Date/Surgical  Date 04/13/20   Pt is currently 4 weeks and 6 days s/p injury   Hand Dominance Right    Next MD Visit 06/08/2020   9am     Precautions   Precautions Other (comment)   Dorsally displaced distal radius fx, non-op   Required Braces or Orthoses Other Brace/Splint   Pt is in initial splint from [facility] - 04/09/2020     Restrictions   Weight Bearing Restrictions Yes    RUE Weight Bearing Non weight bearing      Balance Screen   Has the patient fallen in the past 6 months Yes   Pt sustained fx when walking her dogs   How many times? 1    Has the patient had a decrease in activity level because of a fear of falling?  No    Is the patient reluctant to leave their home because of a fear of falling?  No      Home  Environment   Family/patient expects to be discharged to: Private residence     Living Arrangements Children    Lives With --   Family 15, 58 y/o and 58 y/o on weekends     Prior Function   Level of Independence Independent with basic ADLs    Vocation Other (comment)   Pt reports she has a new job starting "Sunday" at [employer]   Vocation Requirements [employer]: in [employer]    Leisure Kyaking, biking, walking dogs      ADL   ADL comments Pt reports Mod I but has difficulty doing ADL's w/ R dominant hand      Written Expression   Dominant Hand Right      Cognition   Overall Cognitive Status Within Functional Limits for tasks assessed    Behaviors Other (comment)   appears anxious at time     Observation/Other Assessments   Observations Pt presents with initial splint from ER visit.       Sensation   Light Touch Appears Intact   Digits 1-5     Coordination   Gross Motor Movements are Fluid and Coordinated Yes    Fine Motor Movements are Fluid and Coordinated No    Other Pt presents with some c/o shoulder stiffness       Edema   Edema Min edema observed visually in clinic R fingers and dorsal hand winint protective splint            OT Treatments/Exercises (OP) - 05/17/20 0001      ADLs   ADL Comments Pt was educated verbally and written to not use her right handfor funcational activity until she is cleared by Dr Arita MissPace due to the need for further wrist fracture healing. She verbalized understanding in the clinic today.      Exercises   Exercises Hand   Tendon gliding & opposition w/ splint on - see pt instructio     Splinting   Splinting Pt's original post ER splint was removed in clinic. Note pt states that she has removed before "To get a job at [employer], I can't have something on all the time" Pt was advised about current injury and need for protective splinting and that she was not supposed to remove any splint or use her hand functionally at any time as she has a right dorsal displacementand angulation of the distal radius. She verbalized understanding of  this in the clinic today but also stated htat she is starting a new job this coming Sunday. She  was advised to call Dr Thomos Lemons office for work related restrictions. Again, she verbalized understanding of this in the clinic. Pt was fitted with a custom fabricated volar wrist splint, placing her right wrist in neutral posiiton with slight extension at the wrist. She was educated in splinting use, care and precautions written and verbally in the clinic today.            WEARING SCHEDULE:  Wear splint at ALL times except for hygiene care. Do your exercises for your fingers with your splint on. Do not remove your splint or use your hand for any activity at this time while your fracture is healing.  PURPOSE:  To prevent movement and for protection until injury can heal  CARE OF SPLINT:  Keep splint away from heat sources including: stove, radiator or furnace, or a car in sunlight. The splint can melt and will no longer fit you properly  Keep away from pets and children  Clean the splint with rubbing alcohol or cool water and soap as needed. Keep arm and hand dry at all times. Do not get wet..  * During this time, make sure you also clean your hand/arm as instructed by your therapist and/or perform dressing changes as needed. Then dry hand/arm completely before replacing splint. (When cleaning hand/arm, keep it immobilized in same position until splint is replaced)  PRECAUTIONS/POTENTIAL PROBLEMS: *If you notice or experience increased pain, swelling, numbness, or a lingering reddened area from the splint: Contact your therapist immediately by calling [phone]. You must wear the splint for protection, but we will get you scheduled for adjustments as quickly as possible.  (If only straps or hooks need to be replaced and NO adjustments to the splint need to be made, just call the office ahead and let them know you are coming in)  If you have any medical concerns or signs of infection, please call  your doctor immediately   Perform each exercise 4-6 times a day, with your splint on. And hold each position for 3-5 seconds and repeat 10 times (10 reps). Flexor Tendon Gliding (Active Full Fist)    Straighten all fingers, then make a fist, bending all joints. Repeat ____ times. Do ____ sessions per day.  Opposition (Active)    Touch tip of thumb to nail tip of each finger in turn, making an "O" shape. Repeat ____ times. Do ____ sessions per day.  Copyright  VHI. All rights reserved.  Flexor Tendon Gliding (Active Straight Fist)    Start with fingers straight. Bend knuckles and middle joints. Keep fingertip joints straight to touch base of palm. Repeat ____ times. Do ____ sessions per day.  Copyright  VHI. All rights reserved.  Flexor Tendon Gliding (Active Hook Fist)    With fingers and knuckles straight, bend middle and tip joints. Do not bend large knuckles. Repeat ____ times. Do ____ sessions per day.  Copyright  VHI. All rights reserved.     OT Education - 05/17/20 1039    Education Details Splinting use, care and precuations R dominant UE/wrist. Active ROM tendon gliding and opposition exercises within splint. Wear protective splint at all times. Gentle active ROM right elbow and shoulder to avoid stiffness.    Person(s) Educated Patient    Methods Explanation;Demonstration;Verbal cues;Handout    Comprehension Verbalized understanding;Need further instruction;Verbal cues required            OT Short Term Goals - 05/17/20 1205      OT SHORT TERM GOAL #1  Title Pt will be Mod I splinting use, care and precuations R UE/wrist.    Baseline Requires VC's for positioning and precautions.    Time 4    Period Weeks    Status New    Target Date 06/14/20      OT SHORT TERM GOAL #2   Title Pt will be Mod I tendon gliding exercises within protective splint right dominant hand/wrist    Baseline Req vc's for positioning and follow through    Time 4    Period  Weeks    Status New    Target Date 06/14/20      OT SHORT TERM GOAL #3   Title Pt will be Mod I edema control right fingers/wrist within splint.    Baseline Min vc's required    Time 4    Period Weeks    Status New    Target Date 06/14/20             OT Long Term Goals - 05/17/20 1208      OT LONG TERM GOAL #1   Title Pt will be Mod I upgraded HEP R wrist    Baseline dependant    Time 8    Period Weeks    Status New    Target Date 07/12/20      OT LONG TERM GOAL #2   Title Pt will be Mod I edema management R wrist and digits demonstrated by pt ability to state and perform in clinic.    Baseline Dependent/unable due to fracture distal radius    Time 8    Period Weeks    Status New    Target Date 07/12/20      OT LONG TERM GOAL #3   Title Pt will be Mod I light ADL and self care activities as simulated in clinic using R as dominant UE    Baseline unable due to fracture    Time 8    Period Weeks    Status New    Target Date 07/12/20      OT LONG TERM GOAL #4   Title Pt will report pain right wrist during light functional activitiy as 2 or less out of 10.    Baseline Please refer to eval    Time 8    Period Weeks    Status New    Target Date 07/12/20      OT LONG TERM GOAL #5   Title Pt will demonstrate functional grip R UE of 15# or greater as seen by JAMAR dynamometer assessment.    Baseline Unable    Time 8    Period Weeks    Status New    Target Date 07/12/20                 Plan - 05/17/20 1228    Clinical Impression Statement Pt is a pleasant 59 y/o female s/p fall while walking her dogs on 04/13/2020. She sustained a R dorsally displaced distal radius fracture (ICD 10 S52.501A). Her PMH includes but is not limited to tibial fracture 12/2019, ADD, heart murmur, menieres disease. Pt reports that she became dizzy while walking her dogs and fell on her outstretched hand on 04/13/2020 resulting in a dorsally displaced distal radius fracture. She is  currently 4 weeks and 6 days post-op and presents per Dr Arita Miss for custom splinting and hand therapy as her fracture was non-operable. Pt should benefot from cont out pt OT to address deficits in R dominant  UE functional use, edema control, Pt education, splinting and eventual strengthening as per fracture healing and splinting. Pt has a f/u appointment with Dr Arita Miss on 06/08/2020 at which time we should get more information about progressing her home program. It should be noted that Pt reports that she is starting a new job this coming Sunday at [employer] and that she has been removing her cast some per her report. She was educated in splinting use, care and precautuons and instructed to not remove or use R UE for functional use at htis time as per fracture healing and MD orders. She verbalized understanding of this in the clinic today.    OT Occupational Profile and History Problem Focused Assessment - Including review of records relating to presenting problem    Occupational performance deficits (Please refer to evaluation for details): ADL's;IADL's;Work;Leisure    Body Structure / Function / Physical Skills ADL;Strength;Dexterity;Pain;Edema;UE functional use;ROM;Coordination;Flexibility;Sensation;FMC;Decreased knowledge of precautions    Rehab Potential Fair   Pt has been removing previous splint as applied in ER   Clinical Decision Making Limited treatment options, no task modification necessary    Comorbidities Affecting Occupational Performance: May have comorbidities impacting occupational performance    Modification or Assistance to Complete Evaluation  No modification of tasks or assist necessary to complete eval    OT Frequency 2x / week    OT Duration 8 weeks    OT Treatment/Interventions Self-care/ADL training;Fluidtherapy;Splinting;DME and/or AE instruction;Compression bandaging;Therapeutic activities;Ultrasound;Therapeutic exercise;Scar mobilization;Cryotherapy;Passive range of motion;Electrical  Stimulation;Paraffin;Manual Therapy;Patient/family education    Plan Protective splint check and adjustment possibly followed by pause in therapy as fracture cont to heal. Will require guidance from Dr Arita Miss as pt is non-surgical candidate.    Consulted and Agree with Plan of Care Patient           Patient will benefit from skilled therapeutic intervention in order to improve the following deficits and impairments:   Body Structure / Function / Physical Skills: ADL, Strength, Dexterity, Pain, Edema, UE functional use, ROM, Coordination, Flexibility, Sensation, FMC, Decreased knowledge of precautions       Visit Diagnosis: Other lack of coordination - Plan: Ot plan of care cert/re-cert  Pain in right wrist - Plan: Ot plan of care cert/re-cert  Generalized edema - Plan: Ot plan of care cert/re-cert  Muscle weakness (generalized) - Plan: Ot plan of care cert/re-cert  Other disturbances of skin sensation - Plan: Ot plan of care cert/re-cert  Stiffness of right wrist, not elsewhere classified - Plan: Ot plan of care cert/re-cert    Problem List Patient Active Problem List   Diagnosis Date Noted  . Fracture of tibial shaft, left, closed 12/26/2018    Anastyn Ayars [employer], OTR/L 05/17/2020, 12:32 PM  [facility] [address] [place], [place], [address] Phone: [phone]   Fax:  [phone]  Name: Brianna Rhodes MRN: [id number] Date of Birth: Jun 03, 1961

## 2020-05-18 ENCOUNTER — Encounter: Payer: Self-pay | Admitting: Plastic Surgery

## 2020-05-18 NOTE — Telephone Encounter (Signed)
Return to note work being sent to patient today. Must keep splint on while working, avoid lifting greater than 15 lb with affected side.

## 2020-05-25 ENCOUNTER — Ambulatory Visit: Payer: Medicaid Other | Attending: Plastic Surgery | Admitting: Occupational Therapy

## 2020-06-08 ENCOUNTER — Ambulatory Visit (INDEPENDENT_AMBULATORY_CARE_PROVIDER_SITE_OTHER): Payer: Medicaid Other | Admitting: Plastic Surgery

## 2020-06-08 ENCOUNTER — Encounter: Payer: Self-pay | Admitting: Plastic Surgery

## 2020-06-08 ENCOUNTER — Other Ambulatory Visit: Payer: Self-pay

## 2020-06-08 VITALS — BP 141/92 | HR 68 | Temp 97.8°F

## 2020-06-08 DIAGNOSIS — L989 Disorder of the skin and subcutaneous tissue, unspecified: Secondary | ICD-10-CM

## 2020-06-08 DIAGNOSIS — S52501A Unspecified fracture of the lower end of right radius, initial encounter for closed fracture: Secondary | ICD-10-CM

## 2020-06-08 NOTE — Progress Notes (Signed)
Referring Provider Darrow Bussing, MD [address] [place],  [place] [address]   CC: No chief complaint on file.     Brianna Rhodes is an 59 y.o. female.  HPI: Patient is here in follow-up for right distal radius fracture.  She feels like her pain is improving but still hurts when she does anything from a lifting standpoint within her splint.  She has been working on finger range of motion and that is improving quite a bit.  She reports intermittent tingling sensation in both hands but is not bothering her too much.  Patient also wants to discuss aesthetic concerns regarding her face.  She did have a mini facelift done years ago by another Engineer, petroleum in town.  She feels like she has had a recurrence of her jowls and is bothered by the excess skin of her neck.  She would also like some improvement in the infraorbital area.  She is also bothered by the lines in the glabella and fine lines around the mouth.  She does not smoke cigarettes but intermittently smokes marijuana.  She is not a diabetic.  Review of Systems General: Denies fevers or chills  Physical Exam Vitals with BMI 06/08/2020 05/05/2020 04/20/2020  Height - - 5\' 6"   Weight - - 146 lbs 3 oz  BMI - - 23.61  Systolic 141 134  Diastolic 92 70 82  Pulse 68 73 84    General:  No acute distress,  Alert and oriented, Non-Toxic, Normal speech and affect Right hand: Fingers show good range of motion.  Tenderness is dramatically improved.  Sensation is intact on exam today. Face: She has mild gelling and moderate skin excess.  I can see the mini facelift scar around both ears that is healed nicely.  She did point out a skin lesion in the left temple area that looks like a seborrheic keratosis.  Its about a centimeter in size and pigmented and slightly raised.  She does have some fine lines and also what looks like actinic changes from sun exposure throughout her face but more prominently on her  cheeks.  Assessment/Plan Patient is doing well with nonoperative treatment of her distal radius.  She is going to continue therapy.  She is about 2 months out at this point so I have suggested she can start to wean herself out of the splint if she feels ready.  I did bring up the potential for a nerve study to evaluate for compressive neuropathy of the bilateral upper extremities but she does not wish to pursue that at this time.  From the standpoint of her wrist she would like to follow-up on an as-needed basis.  Regarding her face I think she is a great candidate for a facelift and neck lift with fat grafting to the cheeks and infraorbital areas.  The fine lines would best be dealt with afterwards with laser resurfacing.  I do think Botox would soften the glabellar and forehead lines if she would like to pursue that as well.  I offered to shave off the seborrheic keratosis in the left temple at the time of that operation and I would send it for a biopsy to ensure that it was not anything malignant.  She is interested in this plan and will plan to provide a quote for her.  She would like a dermatology referral for the actinic changes and I think that is appropriate and will try to help her with that.  Allena Napoleon 06/08/2020, 10:31 AM

## 2020-06-16 ENCOUNTER — Encounter: Payer: Self-pay | Admitting: Surgical
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
## 2021-10-19 ENCOUNTER — Other Ambulatory Visit: Payer: Self-pay

## 2021-10-19 ENCOUNTER — Ambulatory Visit (INDEPENDENT_AMBULATORY_CARE_PROVIDER_SITE_OTHER): Payer: Medicaid Other | Admitting: Obstetrics

## 2021-10-19 ENCOUNTER — Other Ambulatory Visit (HOSPITAL_COMMUNITY)
Admission: RE | Admit: 2021-10-19 | Discharge: 2021-10-19 | Disposition: A | Discharge status: Home / Self Care | Payer: Medicaid Other | Source: Ambulatory Visit | Attending: Obstetrics | Admitting: Obstetrics

## 2021-10-19 ENCOUNTER — Encounter: Payer: Self-pay | Admitting: Obstetrics

## 2021-10-19 VITALS — BP 138/68 | HR 66 | Ht 66.0 in | Wt 140.0 lb

## 2021-10-19 DIAGNOSIS — Z8669 Personal history of other diseases of the nervous system and sense organs: Secondary | ICD-10-CM | POA: Diagnosis not present

## 2021-10-19 DIAGNOSIS — Z1239 Encounter for other screening for malignant neoplasm of breast: Secondary | ICD-10-CM | POA: Diagnosis not present

## 2021-10-19 DIAGNOSIS — Z01419 Encounter for gynecological examination (general) (routine) without abnormal findings: Secondary | ICD-10-CM | POA: Insufficient documentation

## 2021-10-19 NOTE — Progress Notes (Addendum)
NEW GYN presents for AEX/PAP.  Last Mammogram 12/27/2009

## 2021-10-19 NOTE — Progress Notes (Signed)
Subjective:        Brianna Rhodes is a 61 y.o. female here for a routine exam.  Current complaints: Chronic eye infections.  She thinks that the eye infections may be job related..    Personal health questionnaire:  Is patient Ashkenazi Jewish, have a family history of breast and/or ovarian cancer: no Is there a family history of uterine cancer diagnosed at age < 40, gastrointestinal cancer, urinary tract cancer, family member who is a Field seismologist syndrome-associated carrier: no Is the patient overweight and hypertensive, family history of diabetes, personal history of gestational diabetes, preeclampsia or PCOS: no Is patient over 30, have PCOS,  family history of premature CHD under age 66, diabetes, smoke, have hypertension or peripheral artery disease:  no At any time, has a partner hit, kicked or otherwise hurt or frightened you?: no Over the past 2 weeks, have you felt down, depressed or hopeless?: no Over the past 2 weeks, have you felt little interest or pleasure in doing things?:no   Gynecologic History No LMP recorded. Patient has had an ablation. Contraception: post menopausal status Last Pap: unknown. Results were: normal Last mammogram: unknown. Results were: normal  Obstetric History OB History  Gravida Para Term Preterm AB Living  0 0 0 0 0 0  SAB IAB Ectopic Multiple Live Births  0 0 0 0 0    Past Medical History:  Diagnosis Date   ADD (attention deficit disorder)    Family history of anesthesia complication    father has n/v   Heart murmur    mild MVP   Meniere's disease    PONV (postoperative nausea and vomiting)    Wears glasses     Past Surgical History:  Procedure Laterality Date   APPENDECTOMY     DILATION AND CURETTAGE OF UTERUS  09/25/2003   ENDOMETRIAL ABLATION  09/25/2003   INGUINAL HERNIA REPAIR  09/24/2006   left   OPEN REDUCTION INTERNAL FIXATION (ORIF) DISTAL RADIAL FRACTURE Left 09/25/2013   Procedure: OPEN REDUCTION INTERNAL FIXATION  (ORIF) LEFT DISTAL RADIUS FRACTURE;  Surgeon: Tennis Must, MD;  Location: [facility];  Service: Orthopedics;  Laterality: Left;   TIBIA IM NAIL INSERTION Left 12/27/2018   Procedure: INTRAMEDULLARY (IM) NAIL TIBIAL;  Surgeon: Hiram Gash, MD;  Location: [facility];  Service: Orthopedics;  Laterality: Left;     Current Outpatient Medications:    cephALEXin (KEFLEX) 500 MG capsule, Take 1 capsule (500 mg total) by mouth 3 (three) times daily with meals. (Patient not taking: Reported on 12/27/2018), Disp: 21 capsule, Rfl: 0   enoxaparin (LOVENOX) 40 MG/0.4ML injection, Inject 0.4 mLs (40 mg total) into the skin daily., Disp: 14 Syringe, Rfl: 1   gabapentin (NEURONTIN) 300 MG capsule, Take 300 mg by mouth 3 (three) times daily. Reported on 02/23/2016 (Patient not taking: Reported on 04/20/2020), Disp: , Rfl:    Loratadine (CLARITIN) 10 MG CAPS, Take 10 mg by mouth daily. (Patient not taking: Reported on 04/20/2020), Disp: , Rfl:    loratadine (CLARITIN) 10 MG tablet, 1 tablet, Disp: , Rfl:    promethazine (PHENERGAN) 25 MG tablet, Take 1 tablet (25 mg total) by mouth every 6 (six) hours as needed for nausea. (Patient not taking: Reported on 04/20/2020), Disp: 15 tablet, Rfl: 0   traMADol (ULTRAM) 50 MG tablet, Take 1 tablet (50 mg total) by mouth every 6 (six) hours as needed. (Patient not taking: Reported on 04/20/2020), Disp: 15 tablet, Rfl: 0 Allergies  Allergen  Reactions   Codeine Itching   Percodan [Oxycodone-Aspirin] Nausea And Vomiting    Social History   Tobacco Use   Smoking status: Never   Smokeless tobacco: Never  Substance Use Topics   Alcohol use: No    Comment: quit in 1988    Family History  Problem Relation Age of Onset   Heart Problems Mother    Meniere's disease Mother    Heart Problems Father       Review of Systems  Constitutional: negative for fatigue and weight loss Respiratory: negative for cough and wheezing Cardiovascular: negative for chest pain,  fatigue and palpitations Gastrointestinal: negative for abdominal pain and change in bowel habits Musculoskeletal:negative for myalgias Neurological: negative for gait problems and tremors Behavioral/Psych: negative for abusive relationship, depression Endocrine: negative for temperature intolerance    Genitourinary:negative for abnormal menstrual periods, genital lesions, hot flashes, sexual problems and vaginal discharge Integument/breast: negative for breast lump, breast tenderness, nipple discharge and skin lesion(s)    Objective:       BP 138/68    Pulse 66    Ht 5\' 6"  (1.676 m)    Wt 140 lb (63.5 kg)    BMI 22.60 kg/m  General:   Alert and no distress  Skin:   no rash or abnormalities  Lungs:   clear to auscultation bilaterally  Heart:   regular rate and rhythm, S1, S2 normal, no murmur, click, rub or gallop  Breasts:   normal without suspicious masses, skin or nipple changes or axillary nodes  Abdomen:  normal findings: no organomegaly, soft, non-tender and no hernia  Pelvis:  External genitalia: normal general appearance Urinary system: urethral meatus normal and bladder without fullness, nontender Vaginal: normal without tenderness, induration or masses Cervix: normal appearance Adnexa: normal bimanual exam Uterus: anteverted and non-tender, normal size   Lab Review Urine pregnancy test Labs reviewed yes Radiologic studies reviewed yes  I have spent a total of 20 minutes of face-to-face time, excluding clinical staff time, reviewing notes and preparing to see patient, ordering tests and/or medications, and counseling the patient.   Assessment:    1. Encounter for routine gynecological examination with Papanicolaou smear of cervix Rx: - Cytology - PAP( [place])  2. Screening breast examination Rx: - MM 3D SCREEN BREAST BILATERAL; Future  3. History of chronic eye infection Rx: - Ambulatory referral to Ophthalmology     Plan:    Education reviewed:  calcium supplements, depression evaluation, low fat, low cholesterol diet, safe sex/STD prevention, self breast exams, and weight bearing exercise. Mammogram ordered. Follow up in: 1 year.    Orders Placed This Encounter  Procedures   MM 3D SCREEN BREAST BILATERAL    Standing Status:   Future    Standing Expiration Date:   10/18/2022    Order Specific Question:   Reason for Exam (SYMPTOM  OR DIAGNOSIS REQUIRED)    Answer:   Screening    Order Specific Question:   Is the patient pregnant?    Answer:   No    Order Specific Question:   Preferred imaging location?    Answer:   [facility]   Ambulatory referral to Ophthalmology    Referral Priority:   Routine    Referral Type:   Consultation    Referral Reason:   Specialty Services Required    Requested Specialty:   Ophthalmology    Number of Visits Requested:   1     Shelly Bombard, MD 10/19/2021 3:11 PM

## 2021-10-23 ENCOUNTER — Ambulatory Visit (HOSPITAL_BASED_OUTPATIENT_CLINIC_OR_DEPARTMENT_OTHER)
Admission: RE | Admit: 2021-10-23 | Discharge: 2021-10-23 | Disposition: A | Discharge status: Home / Self Care | Payer: Medicaid Other | Source: Ambulatory Visit | Attending: Obstetrics | Admitting: Obstetrics

## 2021-10-23 ENCOUNTER — Other Ambulatory Visit: Payer: Self-pay

## 2021-10-23 ENCOUNTER — Encounter (HOSPITAL_BASED_OUTPATIENT_CLINIC_OR_DEPARTMENT_OTHER): Payer: Self-pay | Admitting: Radiology

## 2021-10-23 DIAGNOSIS — Z1239 Encounter for other screening for malignant neoplasm of breast: Secondary | ICD-10-CM | POA: Diagnosis present

## 2021-10-23 DIAGNOSIS — Z1231 Encounter for screening mammogram for malignant neoplasm of breast: Secondary | ICD-10-CM | POA: Insufficient documentation

## 2021-10-23 LAB — CYTOLOGY - PAP
Comment: NEGATIVE
Diagnosis: NEGATIVE
High risk HPV: NEGATIVE

## 2023-04-08 ENCOUNTER — Emergency Department (HOSPITAL_COMMUNITY)
Admission: EM | Admit: 2023-04-08 | Discharge: 2023-04-08 | Discharge status: Against Medical Advice | Payer: Managed Care, Other (non HMO) | Attending: Emergency Medicine | Admitting: Emergency Medicine

## 2023-04-08 ENCOUNTER — Other Ambulatory Visit: Payer: Self-pay

## 2023-04-08 DIAGNOSIS — M62830 Muscle spasm of back: Secondary | ICD-10-CM | POA: Insufficient documentation

## 2023-04-08 DIAGNOSIS — Z5321 Procedure and treatment not carried out due to patient leaving prior to being seen by health care provider: Secondary | ICD-10-CM | POA: Diagnosis not present

## 2023-04-08 DIAGNOSIS — M545 Low back pain, unspecified: Secondary | ICD-10-CM | POA: Diagnosis present

## 2023-04-08 NOTE — ED Triage Notes (Signed)
Pt reports lower back pain and spasms since Friday. Pain worse with movement. Denies injury. No OTC meds PTA.

## 2023-04-08 NOTE — ED Notes (Signed)
Called pts name multiple times with no response

## 2023-06-25 NOTE — Progress Notes (Unsigned)
Cardiology Office Note:   Date:  06/27/2023  ID:  Brianna Rhodes, DOB 01/10/1961, MRN [id number] PCP:  Darrow Bussing, MD  [facility] Providers Cardiologist:  Alverda Skeans, MD Referring MD: Darrow Bussing, MD   Chief Complaint/Reason for Referral: PVCs ASSESSMENT:    1. Palpitations   2. Preoperative cardiovascular examination     PLAN:   In order of problems listed above: 1.  Palpitations: Will obtain reflex TSH, CMP, CBC, monitor, and echocardiogram.  Depending on echo results and PVC burden we could consider a cardiac MRI as well. 2.  Preoperative cardiovascular examination: The patient can do 4 METS of activity without any issues.  Her dental procedure will be done with general anesthesia however it is a low risk procedure from a cardiovascular standpoint.  Only if the patient's echocardiogram demonstrates severe abnormalities without any further workup be required.      Dispo:  Return if symptoms worsen or fail to improve.    Medication Adjustments/Labs and Tests Ordered: Current medicines are reviewed at length with the patient today.  Concerns regarding medicines are outlined above.  The following changes have been made:  no change   Labs/tests ordered: Orders Placed This Encounter  Procedures   CBC   Comprehensive metabolic panel   TSH Rfx on Abnormal to Free T4   LONG TERM MONITOR (3-14 DAYS)   EKG 12-Lead   ECHOCARDIOGRAM COMPLETE    Medication Changes: No orders of the defined types were placed in this encounter.   Current medicines are reviewed at length with the patient today.  The patient does not have concerns regarding medicines.  History of Present Illness:    FOCUSED PROBLEM LIST:   Seasonal allergies  The patient is a 62 y.o. female with the indicated medical history here for recommendations regarding insulin noted skipped beats.  The patient was at her dentist office and on auscultation was noticed that the patient had occasional skipped  beats.  No EKG was done.  Because of this the patient was seen by her PCP and referred to cardiology for further evaluation and treatment and recommendations regarding cardiac evaluation prior to a dental procedure.  The patient is very active at baseline.  She does weightlifting and rides her bike at least 30 minutes several times a week without angina or significant dyspnea.  She denies any palpitations, presyncope or syncope.  She has had no issues with severe bleeding or bruising.  She really feels quite healthy.  He currently works at [employer].  She does not smoke tobacco but uses marijuana occasionally.    Current Medications: Current Meds  Medication Sig   Loratadine (CLARITIN) 10 MG CAPS Take 10 mg by mouth daily.      Review of Systems:   Please see the history of present illness.    All other systems reviewed and are negative.      EKGs/Labs/Other Test Reviewed:   EKG:  EKG Interpretation Date/Time:  Wednesday June 26 2023 16:00:27 EDT Ventricular Rate:  84 PR Interval:  148 QRS Duration:  106 QT Interval:  368 QTC Calculation: 434 R Axis:   -17  Text Interpretation: Sinus rhythm with frequent Premature ventricular complexes Incomplete right bundle branch block When compared with ECG of 27-Dec-2018 05:58, Premature ventricular complexes are now Present Confirmed by Alverda Skeans ([id number]) on 06/26/2023 4:03:02 PM        RELEVANT IMAGING: Imaging reviewed demonstrates no evidence of aortic atherosclerosis or coronary artery calcification  Risk Assessment/Calculations:  Physical Exam:   VS:  BP 118/72   Pulse 84   Ht 5' 5.5" (1.664 m)   Wt 134 lb (60.8 kg)   BMI 21.96 kg/m        Wt Readings from Last 3 Encounters:  06/26/23 134 lb (60.8 kg)  04/08/23 131 lb (59.4 kg)  10/19/21 140 lb (63.5 kg)      GENERAL:  No apparent distress, AOx3 HEENT:  No carotid bruits, +2 carotid impulses, no scleral icterus CAR: RRR with occasional ectopy no murmurs,  gallops, rubs, or thrills RES:  Clear to auscultation bilaterally ABD:  Soft, nontender, nondistended, positive bowel sounds x 4 VASC:  +2 radial pulses, +2 carotid pulses NEURO:  CN 2-12 grossly intact; motor and sensory grossly intact PSYCH:  No active depression or anxiety EXT:  No edema, ecchymosis, or cyanosis  Signed, Orbie Pyo, MD  06/27/2023 7:07 AM    [facility] [address] [place], [place], [place]  [address] Phone: [phone]; Fax: [phone]   Note:  This document was prepared using Dragon voice recognition software and may include unintentional dictation errors.

## 2023-06-26 ENCOUNTER — Ambulatory Visit: Payer: Medicaid Other | Attending: Internal Medicine | Admitting: Internal Medicine

## 2023-06-26 ENCOUNTER — Ambulatory Visit: Payer: Managed Care, Other (non HMO) | Attending: Internal Medicine

## 2023-06-26 ENCOUNTER — Encounter: Payer: Self-pay | Admitting: Internal Medicine

## 2023-06-26 VITALS — BP 118/72 | HR 84 | Ht 65.5 in | Wt 134.0 lb

## 2023-06-26 DIAGNOSIS — R002 Palpitations: Secondary | ICD-10-CM

## 2023-06-26 DIAGNOSIS — Z01810 Encounter for preprocedural cardiovascular examination: Secondary | ICD-10-CM

## 2023-06-26 NOTE — Patient Instructions (Addendum)
Medication Instructions:  Your physician recommends that you continue on your current medications as directed. Please refer to the Current Medication list given to you today.  *If you need a refill on your cardiac medications before your next appointment, please call your pharmacy*  Lab Work: TODAY: reflex TSH, CBC, CMP If you have labs (blood work) drawn today and your tests are completely normal, you will receive your results only by: MyChart Message (if you have MyChart) OR A paper copy in the mail If you have any lab test that is abnormal or we need to change your treatment, we will call you to review the results.   Testing/Procedures: Your physician has requested that you wear a Zio heart monitor for 7 days. This will be mailed to your home with instructions on how to apply the monitor and how to return it when finished. Please allow 2 weeks after returning the heart monitor before our office calls you with the results.   Your physician has requested that you have an echocardiogram. Echocardiography is a painless test that uses sound waves to create images of your heart. It provides your doctor with information about the size and shape of your heart and how well your heart's chambers and valves are working. This procedure takes approximately one hour. There are no restrictions for this procedure. Please do NOT wear cologne, perfume, aftershave, or lotions (deodorant is allowed). Please arrive 15 minutes prior to your appointment time.   Follow-Up: At [facility], you and your health needs are our priority.  As part of our continuing mission to provide you with exceptional heart care, we have created designated Provider Care Teams.  These Care Teams include your primary Cardiologist (physician) and Advanced Practice Providers (APPs -  Physician Assistants and Nurse Practitioners) who all work together to provide you with the care you need, when you need it.  We recommend signing up for  the patient portal called "MyChart".  Sign up information is provided on this After Visit Summary.  MyChart is used to connect with patients for Virtual Visits (Telemedicine).  Patients are able to view lab/test results, encounter notes, upcoming appointments, etc.  Non-urgent messages can be sent to your provider as well.   To learn more about what you can do with MyChart, go to [email or web address].    Your next appointment:   As needed  The format for your next appointment:   In Person  Provider:   Orbie Pyo, MD {  Other Instructions Christena Deem- Long Term Monitor Instructions     Your physician has requested you wear a ZIO patch monitor for 7 days.  This is a single patch monitor. [employer] supplies one patch monitor per enrollment. Additional  stickers are not available. Please do not apply patch if you will be having a Nuclear Stress Test,  Echocardiogram, Cardiac CT, MRI, or Chest Xray during the period you would be wearing the  monitor. The patch cannot be worn during these tests. You cannot remove and re-apply the  ZIO XT patch monitor.  Your ZIO patch monitor will be mailed 3 day USPS to your address on file. It may take 3-5 days  to receive your monitor after you have been enrolled.  Once you have received your monitor, please review the enclosed instructions. Your monitor  has already been registered assigning a specific monitor serial # to you.     Billing and Patient Assistance Program Information     We have supplied [employer] with  any of your insurance information on file for billing purposes.  [employer] offers a sliding scale Patient Assistance Program for patients that do not have  insurance, or whose insurance does not completely cover the cost of the ZIO monitor.  You must apply for the Patient Assistance Program to qualify for this discounted rate.  To apply, please call [employer] at [phone], select option 4, select option 2, ask to apply for  Patient Assistance  Program. Meredeth Ide will ask your household income, and how many people  are in your household. They will quote your out-of-pocket cost based on that information.  [employer] will also be able to set up a 4-month, interest-free payment plan if needed.     Applying the monitor     Shave hair from upper left chest.  Hold abrader disc by orange tab. Rub abrader in 40 strokes over the upper left chest as  indicated in your monitor instructions.  Clean area with 4 enclosed alcohol pads. Let dry.  Apply patch as indicated in monitor instructions. Patch will be placed under collarbone on left  side of chest with arrow pointing upward.  Rub patch adhesive wings for 2 minutes. Remove white label marked "1". Remove the white  label marked "2". Rub patch adhesive wings for 2 additional minutes.  While looking in a mirror, press and release button in center of patch. A small green light will  flash 3-4 times. This will be your only indicator that the monitor has been turned on.  Do not shower for the first 24 hours. You may shower after the first 24 hours.  Press the button if you feel a symptom. You will hear a small click. Record Date, Time and  Symptom in the Patient Logbook.  When you are ready to remove the patch, follow instructions on the last 2 pages of Patient  Logbook. Stick patch monitor onto the last page of Patient Logbook.  Place Patient Logbook in the blue and white box. Use locking tab on box and tape box closed  securely. The blue and white box has prepaid postage on it. Please place it in the mailbox as  soon as possible. Your physician should have your test results approximately 7 days after the  monitor has been mailed back to [facility].  Call [facility] Customer Care at [phone] if you have questions regarding  your ZIO XT patch monitor. Call them immediately if you see an orange light blinking on your  monitor.  If your monitor falls off in less than 4 days, contact our  Monitor department at [phone].  If your monitor becomes loose or falls off after 4 days call [employer] at [phone] for  suggestions on securing your monitor.

## 2023-06-26 NOTE — Progress Notes (Unsigned)
Enrolled patient for a 7 day Zio XT monitor to be mailed to patients home.  

## 2023-06-27 LAB — COMPREHENSIVE METABOLIC PANEL
ALT: 19 [IU]/L (ref 0–32)
AST: 22 [IU]/L (ref 0–40)
Albumin: 4.5 g/dL (ref 3.9–4.9)
Alkaline Phosphatase: 89 [IU]/L (ref 44–121)
BUN/Creatinine Ratio: 15 (ref 12–28)
BUN: 15 mg/dL (ref 8–27)
Bilirubin Total: 0.4 mg/dL (ref 0.0–1.2)
CO2: 25 mmol/L (ref 20–29)
Calcium: 9.9 mg/dL (ref 8.7–10.3)
Chloride: 99 mmol/L (ref 96–106)
Creatinine, Ser: 0.99 mg/dL (ref 0.57–1.00)
Globulin, Total: 2.2 g/dL (ref 1.5–4.5)
Glucose: 103 mg/dL — ABNORMAL HIGH (ref 70–99)
Potassium: 4.2 mmol/L (ref 3.5–5.2)
Sodium: 137 mmol/L (ref 134–144)
Total Protein: 6.7 g/dL (ref 6.0–8.5)
eGFR: 65 mL/min/{1.73_m2} (ref >59)

## 2023-06-27 LAB — CBC
Hematocrit: 42.5 % (ref 34.0–46.6)
Hemoglobin: 14.1 g/dL (ref 11.1–15.9)
MCH: 31.1 pg (ref 26.6–33.0)
MCHC: 33.2 g/dL (ref 31.5–35.7)
MCV: 94 fL (ref 79–97)
Platelets: 181 10*3/uL (ref 150–450)
RBC: 4.54 x10E6/uL (ref 3.77–5.28)
RDW: 12.8 % (ref 11.7–15.4)
WBC: 7.8 10*3/uL (ref 3.4–10.8)

## 2023-06-27 LAB — TSH RFX ON ABNORMAL TO FREE T4: TSH: 1.06 u[IU]/mL (ref 0.450–4.500)

## 2023-07-11 ENCOUNTER — Ambulatory Visit (HOSPITAL_COMMUNITY): Payer: Managed Care, Other (non HMO) | Attending: Internal Medicine

## 2023-07-11 DIAGNOSIS — Z01810 Encounter for preprocedural cardiovascular examination: Secondary | ICD-10-CM | POA: Diagnosis not present

## 2023-07-11 DIAGNOSIS — R002 Palpitations: Secondary | ICD-10-CM | POA: Insufficient documentation

## 2023-07-11 LAB — ECHOCARDIOGRAM COMPLETE
Area-P 1/2: 3.21 cm2
S' Lateral: 3.2 cm

## 2023-07-16 NOTE — Progress Notes (Unsigned)
Cardiology Office Note:   Date:  07/17/2023  ID:  Brianna Rhodes, DOB 1961/08/01, MRN [id number] PCP:  Darrow Bussing, MD  [facility] Providers Cardiologist:  Alverda Skeans, MD Referring MD: Darrow Bussing, MD  Chief Complaint/Reason for Referral: Cardiology follow-up ASSESSMENT:    1. Palpitations   2. Preoperative cardiovascular examination     PLAN:   In order of problems listed above: Palpitations: The patient's echocardiogram and laboratory evaluation is reassuring.  Her monitor is still ongoing.  Her EKG previously demonstrated occasional PVCs.  Her exam today demonstrates occasional ectopy. Preoperative cardiovascular examination: Her echocardiogram demonstrates preserved LV function and no significant valvular abnormalities.  She had is at low risk for cardiovascular issue around her dental procedure and I would like to see her monitor results to ensure that she has not have any malignant arrhythmias.            Dispo:  Return in about 6 months (around 01/15/2024).      Medication Adjustments/Labs and Tests Ordered: Current medicines are reviewed at length with the patient today.  Concerns regarding medicines are outlined above.  The following changes have been made:  no change   Labs/tests ordered: No orders of the defined types were placed in this encounter.   Medication Changes: No orders of the defined types were placed in this encounter.   Current medicines are reviewed at length with the patient today.  The patient does not have concerns regarding medicines.  I spent 30 minutes reviewing all clinical data during and prior to this visit including all relevant imaging studies, laboratories, clinical information from other health systems, and prior notes from both Cardiology and other specialties, interviewing the patient, and conducting a complete physical examination in order to formulate a comprehensive and personalized evaluation and treatment  plan.  History of Present Illness:      FOCUSED PROBLEM LIST:   Seasonal allergies Incomplete right bundle branch block PVCs  06/26/2023: The patient is a 62 y.o. female with the indicated medical history here for recommendations regarding insulin noted skipped beats.  The patient was at her dentist office and on auscultation was noticed that the patient had occasional skipped beats.  No EKG was done.  Because of this the patient was seen by her PCP and referred to cardiology for further evaluation and treatment and recommendations regarding cardiac evaluation prior to a dental procedure.   The patient is very active at baseline.  She does weightlifting and rides her bike at least 30 minutes several times a week without angina or significant dyspnea.  She denies any palpitations, presyncope or syncope.  She has had no issues with severe bleeding or bruising.  She really feels quite healthy.  He currently works at [employer].  She does not smoke tobacco but uses marijuana occasionally.  Plan: Reflex TSH, CMP, CBC, monitor, and echocardiogram.  Proceed with dental evaluation as informed by echo results.  07/17/2023: The patient returns for follow-up to discuss her echo results.  Her echocardiogram demonstrated normal LV function with grade 1 diastolic dysfunction and mild valvular abnormalities.  Her monitor is still in process.  Her laboratories were within normal limits.  She is here today to discuss her echocardiogram and monitor results.  She is able to do everything she needs to do in a day.  She works out to a very high intensity without chest pain or shortness of breath.  She has noticed occasional palpitations but she has had no presyncope or syncope.  She apparently had some issues with her monitor that involved her daughter however she was able to wear it for 3 or 4 days and is wondering exactly how to return this in order for it to be read.        Current Medications: Current Meds  Medication Sig    Loratadine (CLARITIN) 10 MG CAPS Take 10 mg by mouth in the morning and at bedtime.   Olopatadine HCl 0.2 % SOLN 1 drop into affected eye Ophthalmic Once a day for 30 days   trimethoprim-polymyxin b (POLYTRIM) ophthalmic solution 1 drop into affected eye Ophthalmic Four times a day for 7 days     Review of Systems:   Please see the history of present illness.    All other systems reviewed and are negative.     EKGs/Labs/Other Test Reviewed:   EKG: EKG performed October 2024 demonstrates sinus rhythm with PVCs and incomplete right bundle branch block.  EKG Interpretation Date/Time:    Ventricular Rate:    PR Interval:    QRS Duration:    QT Interval:    QTC Calculation:   R Axis:      Text Interpretation:           Risk Assessment/Calculations:          Physical Exam:   VS:  BP 102/62   Pulse 64   Ht 5' 5.5" (1.664 m)   Wt 133 lb 12.8 oz (60.7 kg)   SpO2 90%   BMI 21.93 kg/m        Wt Readings from Last 3 Encounters:  07/17/23 133 lb 12.8 oz (60.7 kg)  06/26/23 134 lb (60.8 kg)  04/08/23 131 lb (59.4 kg)      GENERAL:  No apparent distress, AOx3 HEENT:  No carotid bruits, +2 carotid impulses, no scleral icterus CAR: RRR with occasional ectopy, no murmurs gallops rubs or thrills. RES:  Clear to auscultation bilaterally ABD:  Soft, nontender, nondistended, positive bowel sounds x 4 VASC:  +2 radial pulses, +2 carotid pulses NEURO:  CN 2-12 grossly intact; motor and sensory grossly intact PSYCH:  No active depression or anxiety EXT:  No edema, ecchymosis, or cyanosis  Signed, Orbie Pyo, MD  07/17/2023 4:44 PM    [facility] [address] [place], [place], [place]  [address] Phone: [phone]; Fax: [phone]   Note:  This document was prepared using Dragon voice recognition software and may include unintentional dictation errors.

## 2023-07-17 ENCOUNTER — Ambulatory Visit: Payer: Managed Care, Other (non HMO) | Attending: Internal Medicine | Admitting: Internal Medicine

## 2023-07-17 ENCOUNTER — Encounter: Payer: Self-pay | Admitting: Internal Medicine

## 2023-07-17 VITALS — BP 102/62 | HR 64 | Ht 65.5 in | Wt 133.8 lb

## 2023-07-17 DIAGNOSIS — R002 Palpitations: Secondary | ICD-10-CM | POA: Diagnosis not present

## 2023-07-17 DIAGNOSIS — Z01810 Encounter for preprocedural cardiovascular examination: Secondary | ICD-10-CM | POA: Diagnosis not present

## 2023-07-17 NOTE — Patient Instructions (Signed)
 Medication Instructions:  Your physician recommends that you continue on your current medications as directed. Please refer to the Current Medication list given to you today.  *If you need a refill on your cardiac medications before your next appointment, please call your pharmacy*   Lab Work: none If you have labs (blood work) drawn today and your tests are completely normal, you will receive your results only by: MyChart Message (if you have MyChart) OR A paper copy in the mail If you have any lab test that is abnormal or we need to change your treatment, we will call you to review the results.   Testing/Procedures: none   Follow-Up: At [facility], you and your health needs are our priority.  As part of our continuing mission to provide you with exceptional heart care, we have created designated Provider Care Teams.  These Care Teams include your primary Cardiologist (physician) and Advanced Practice Providers (APPs -  Physician Assistants and Nurse Practitioners) who all work together to provide you with the care you need, when you need it.  We recommend signing up for the patient portal called "MyChart".  Sign up information is provided on this After Visit Summary.  MyChart is used to connect with patients for Virtual Visits (Telemedicine).  Patients are able to view lab/test results, encounter notes, upcoming appointments, etc.  Non-urgent messages can be sent to your provider as well.   To learn more about what you can do with MyChart, go to [email or web address].    Your next appointment:   6 month(s)  Provider:   Jari Favre, PA-C, Ronie Spies, PA-C, Robin Searing, NP, Jacolyn Reedy, PA-C, Eligha Bridegroom, NP, Tereso Newcomer, PA-C, or Perlie Gold, PA-C         Other Instructions
